# Patient Record
Sex: Male | Born: 1955 | Race: White | Hispanic: No | Marital: Married | Smoking: Never smoker
Health system: Southern US, Community
[De-identification: ages and names within clinical notes are randomized; demographics above are authoritative.]

## PROBLEM LIST (undated history)

## (undated) DIAGNOSIS — I10 Essential (primary) hypertension: Secondary | ICD-10-CM

## (undated) DIAGNOSIS — E785 Hyperlipidemia, unspecified: Secondary | ICD-10-CM

---

## 2002-07-20 ENCOUNTER — Ambulatory Visit (HOSPITAL_BASED_OUTPATIENT_CLINIC_OR_DEPARTMENT_OTHER): Admission: RE | Admit: 2002-07-20 | Discharge: 2002-07-21 | Payer: Self-pay | Admitting: Orthopedic Surgery

## 2009-04-25 ENCOUNTER — Emergency Department (HOSPITAL_BASED_OUTPATIENT_CLINIC_OR_DEPARTMENT_OTHER): Admission: EM | Admit: 2009-04-25 | Discharge: 2009-04-25 | Payer: Self-pay | Admitting: Emergency Medicine

## 2009-06-20 ENCOUNTER — Ambulatory Visit (HOSPITAL_BASED_OUTPATIENT_CLINIC_OR_DEPARTMENT_OTHER): Admission: RE | Admit: 2009-06-20 | Discharge: 2009-06-21 | Payer: Self-pay | Admitting: Orthopedic Surgery

## 2009-07-04 ENCOUNTER — Encounter: Admission: RE | Admit: 2009-07-04 | Discharge: 2009-08-21 | Payer: Self-pay | Admitting: Orthopedic Surgery

## 2010-05-28 LAB — POCT HEMOGLOBIN-HEMACUE: Hemoglobin: 14.9 g/dL (ref 13.0–17.0)

## 2010-07-25 NOTE — Op Note (Signed)
NAME:  Randall Jimenez, Randall Jimenez NO.:  [id number]   MEDICAL RECORD NO.:  [id number]                   PATIENT TYPE:  AMB   LOCATION:  DSC                                  FACILITY:  [facility]   PHYSICIAN:  Loreta Ave, M.D.              DATE OF BIRTH:  05-Nov-1955   DATE OF PROCEDURE:  07/20/2002  DATE OF DISCHARGE:                                 OPERATIVE REPORT   PREOPERATIVE DIAGNOSIS:  Left knee anterior cruciate ligament tear and  lateral meniscus tear.   POSTOPERATIVE DIAGNOSIS:  Left knee anterior cruciate ligament tear and  lateral meniscus tear.  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 No intra-articular loose body.   PROCEDURE:  Left knee examination under anesthesia, arthroscopy, and partial  lateral meniscectomy, arthroscopic endoscopic ACL reconstruction, patella  tendon allograft bone tendon bone with notch plasty, and bioabsorbable screw  fixation.   SURGEON:  Loreta Ave, M.D.   ASSISTANT:  Arlys John D. Petrarca, P.A.-C.   ANESTHESIA:  General.   ESTIMATED BLOOD LOSS:  Minimal.   TOURNIQUET TIME:  55 minutes.   SPECIMENS:  None.  Cultures; none.   COMPLICATIONS:  None.  Dressing; soft compressive with knee immobilizer.   DESCRIPTION OF PROCEDURE:  The patient was brought to the operating room and  after adequate anesthesia had been obtained, the left knee examined.  Positive lateral McMurray's with a displaced fragment lacking full extension  by about 5 degrees.  Positive Lachman, positive drawer, positive pivot-  shift. Collaterals posterior cruciate ligament stable.  Tourniquet applied,  prepped and draped in the usual sterile fashion.  Exsanguinated with  elevation of Esmarch.  Tourniquet inflated to 350 mmHg.  Three portals  created, one superolateral and one each medial and lateral parapatellar.  Inflow catheter introduced.  Knee extended.  Arthroscope introduced and the  knee inspected.  Some focal  chronic grade III changes on the trochlea,  patellofemoral joint very mild.  No chondroplasty necessary.  Good  patellofemoral tracking.  Medial meniscus, medial compartment intact.  Despite the findings on MRI with a questionable loose body, no loose body  was seen in the medial or posterior aspect of the knee or any other  recesses.  It was felt that the ACL tear up near the femoral attachment  irreparable.  ACL debrided.  Notch opened up with a notch plasty.  Lateral  meniscus bucket handle tear irreparable. The torn fragments were removed and  tapered down smoothly leaving a rim of meniscus all the way around.  The  remaining articular cartilage in the medial and lateral compartments looked  good.  All recesses were then thoroughly examined and no loose bodies were  seen.  Graft repair of a 10 mm tunnel with bone tendon bone.  Small incision  made medial to the tibial tubercle.  K-wire driven exiting at the footprint  of the ACL on the tibia.  Overdrilled with a 10 mm reamer.  Femoral  guide  inserted across the tibial tunnel notch and on the back cortex of the femur.  K-wire driven and overdrilled with a reamer for placement of the graft.  Both tunnels were assessed and found to be in good position. Debris cleared  from all recesses.  Two pin passers inserted across both tunnels and out  through a stab wound in the inferolateral thigh.  Nitinol wire brought in  the medial portal and out through the femoral tunnel.  The grafts were  attached to the two pin passers and pulled __________ seating the pegs well  in the tibia and in the femur.  Fixed in the femoral tunnel over the Nitinol  wire with a 9 x 25 bioabsorbable screw.  Excellent capture and fixation.  Nitinol wire and sutures removed.  Knee placed at 90 degrees of flexion,  posterior drawer applied, the graft pulled tightly and fixed in the tibial  tunnel over a Nitinol wire with a 9 x 25 bioabsorbable screw.  Excellent  capture  and fixation of the graft above and below.  Full motion, excellent  stability, and good clearance of the graft when viewed arthroscopically at  completion. All wounds were irrigated and closed with subcutaneous and  subcuticular Vicryl.  Portals closed with nylon. Margins of the wounds and  knee injected with Marcaine. Sterile compressive dressing applied.  Tourniquet deflated and removed.  Knee immobilizer applied.  Anesthesia  reversed.  Brought to the recovery room.  Tolerated the surgery well with no  complications.                                               Loreta Ave, M.D.    DFM/MEDQ  D:  07/20/2002  T:  07/21/2002  Job:  [id number]

## 2016-11-18 ENCOUNTER — Other Ambulatory Visit: Payer: Self-pay | Admitting: Family Medicine

## 2016-11-18 DIAGNOSIS — N289 Disorder of kidney and ureter, unspecified: Secondary | ICD-10-CM

## 2016-11-27 ENCOUNTER — Other Ambulatory Visit: Payer: Self-pay

## 2017-12-13 ENCOUNTER — Ambulatory Visit
Admission: RE | Admit: 2017-12-13 | Discharge: 2017-12-13 | Disposition: A | Discharge status: Home / Self Care | Payer: Self-pay | Source: Ambulatory Visit | Attending: Family Medicine | Admitting: Family Medicine

## 2017-12-13 DIAGNOSIS — N289 Disorder of kidney and ureter, unspecified: Secondary | ICD-10-CM

## 2020-03-23 IMAGING — US US RENAL
1 series · 14 of 25 positions shown · non-contrast
Comparison: None.

CLINICAL DATA: Abnormal kidney function.

EXAM:
RENAL / URINARY TRACT ULTRASOUND COMPLETE

[Series 1: us renal · 0.23mm/px · 14 of 38 slices shown]
[im 1/38]
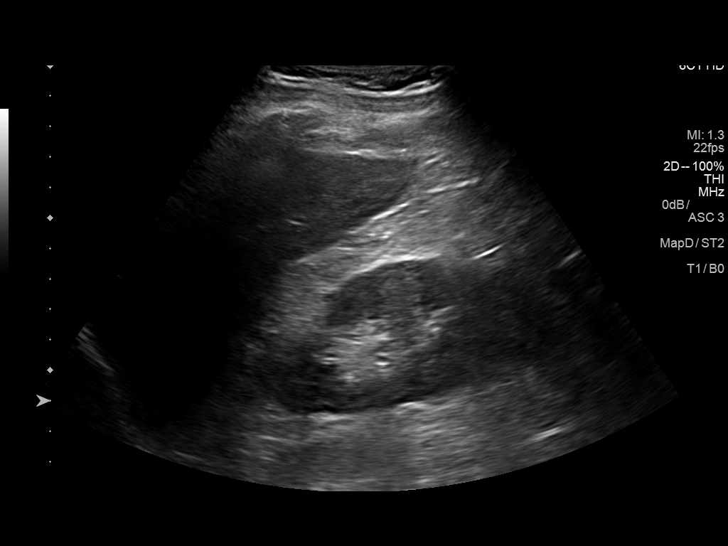
[im 4/38]
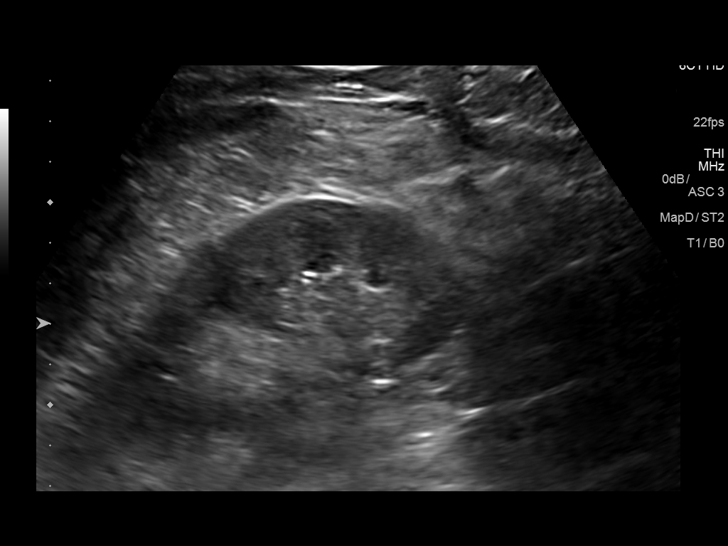
[im 7/38]
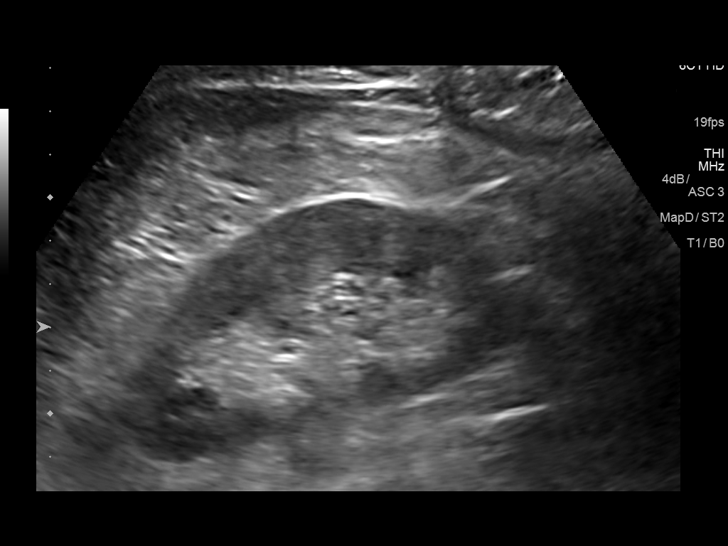
[im 10/38]
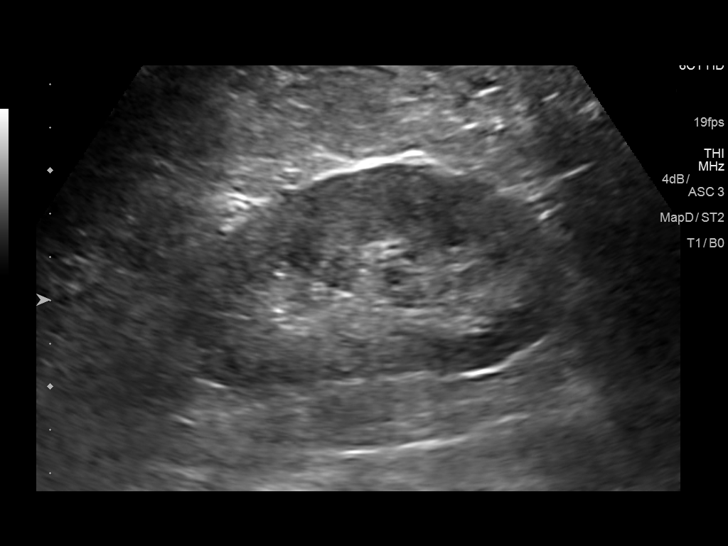
[im 13/38]
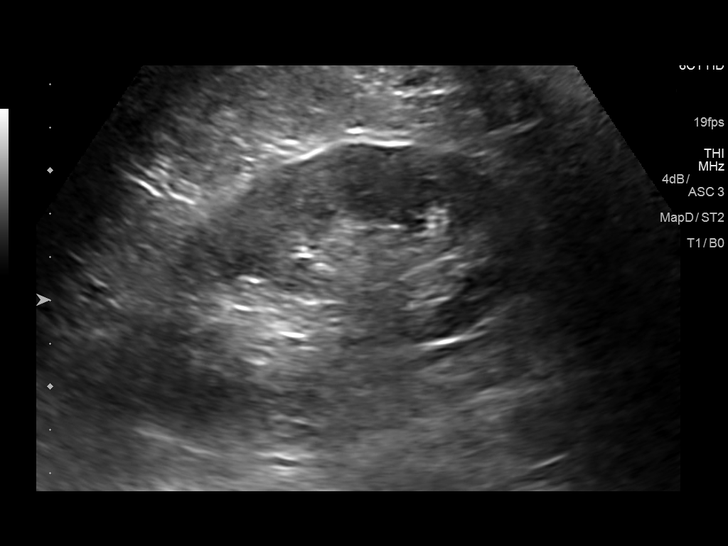
[im 14/38]
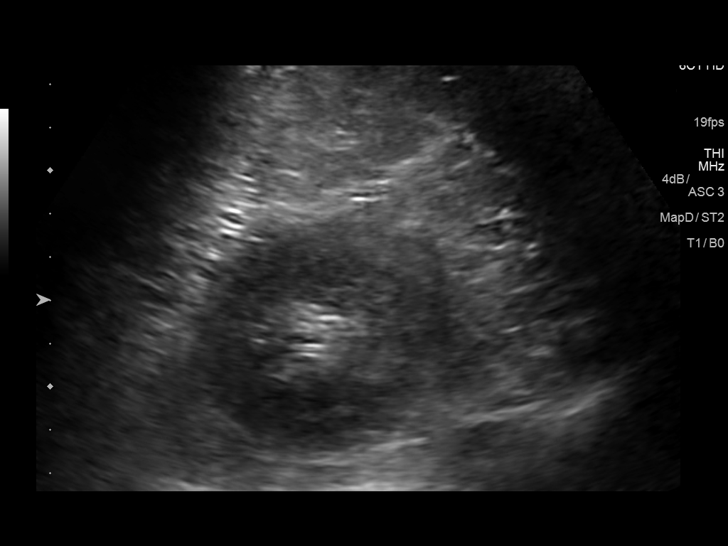
[im 17/38]
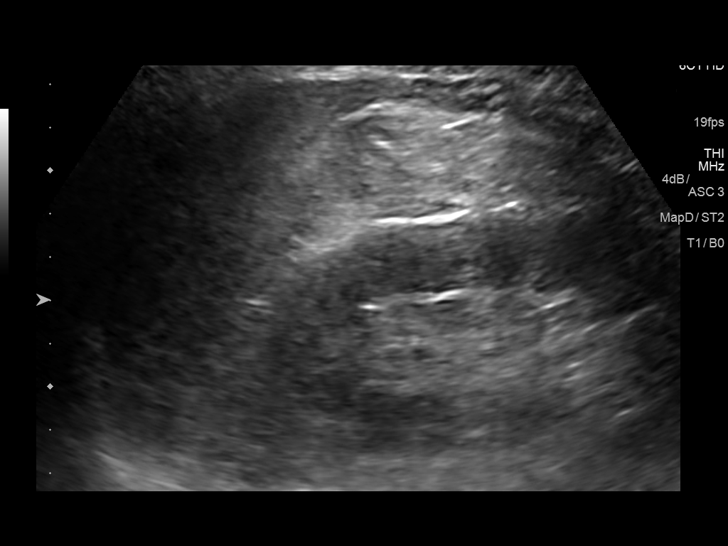
[im 21/38]
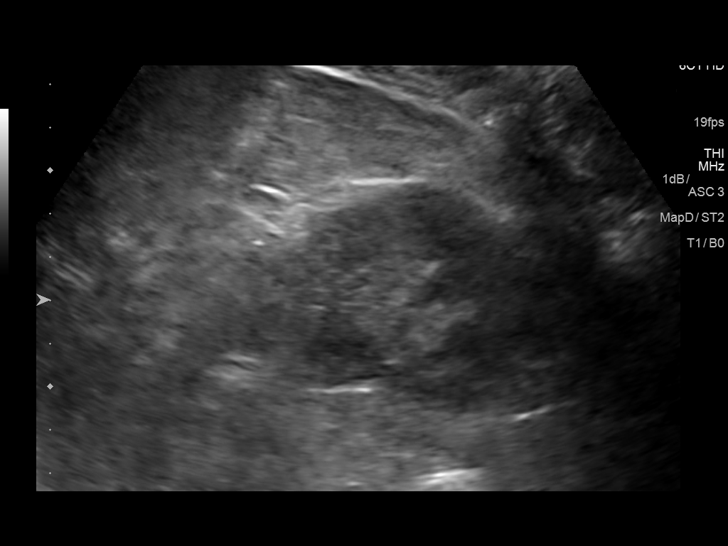
[im 24/38]
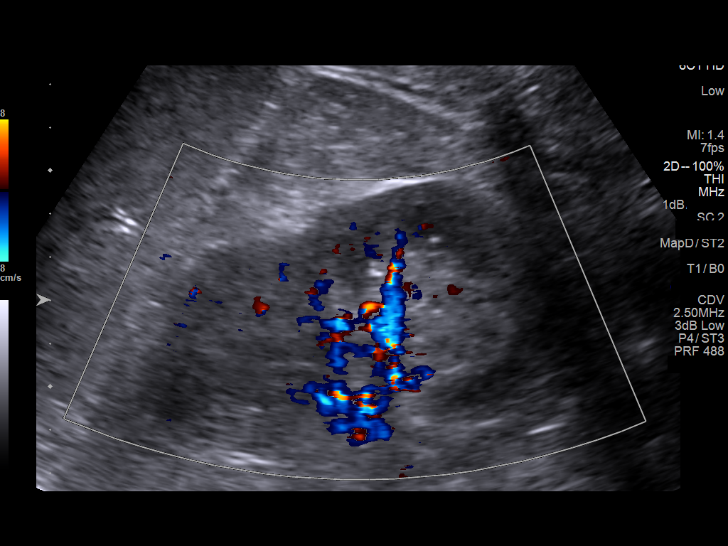
[im 25/38]
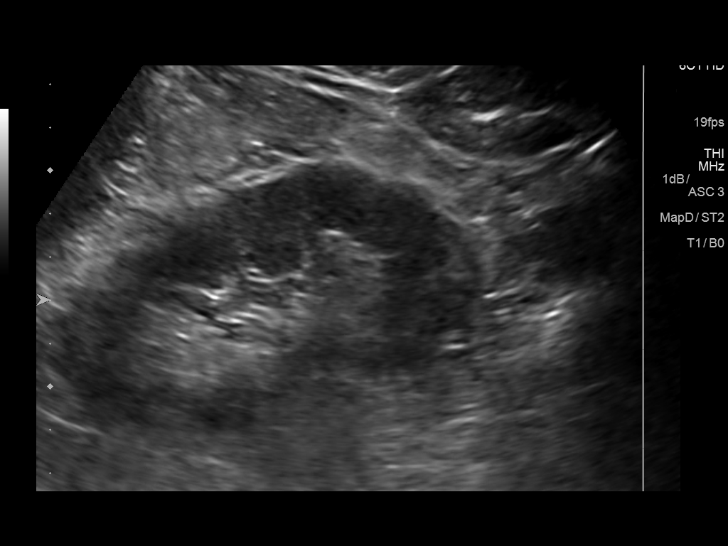
[im 28/38]
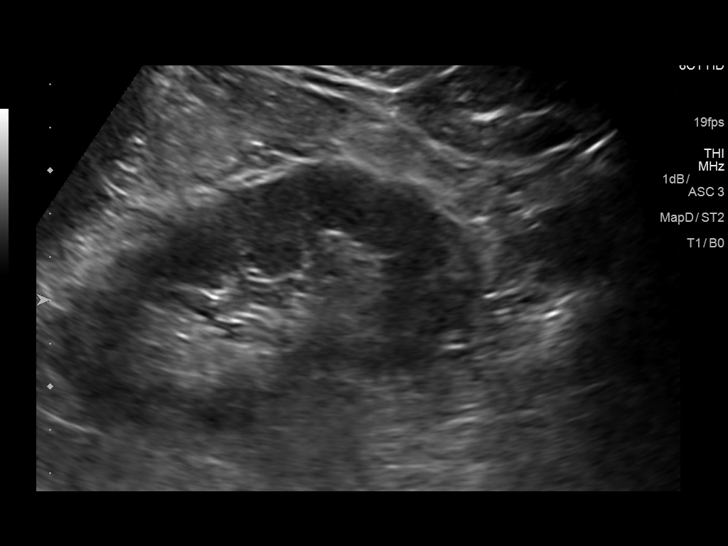
[im 31/38]
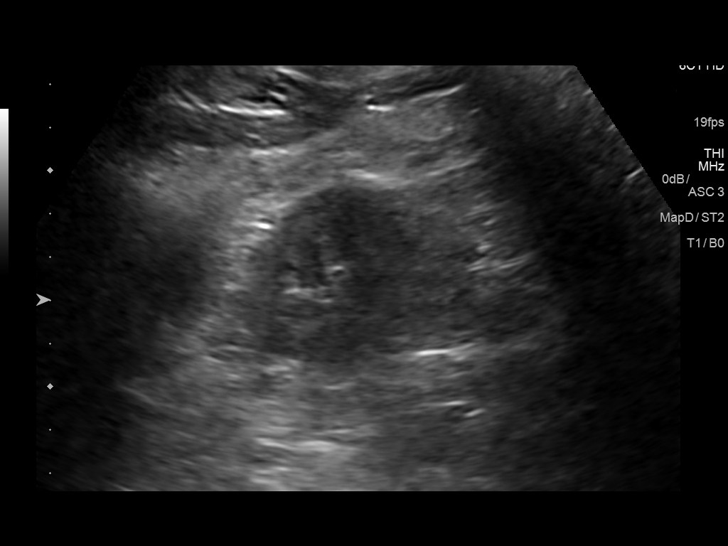
[im 34/38]
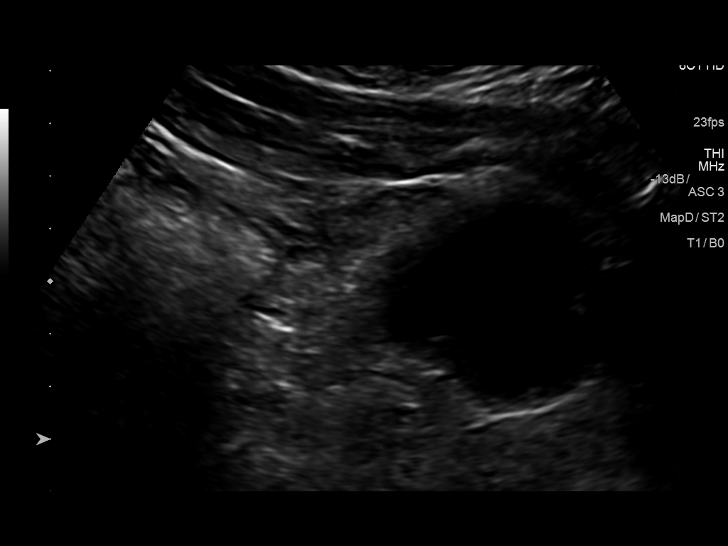
[im 38/38]
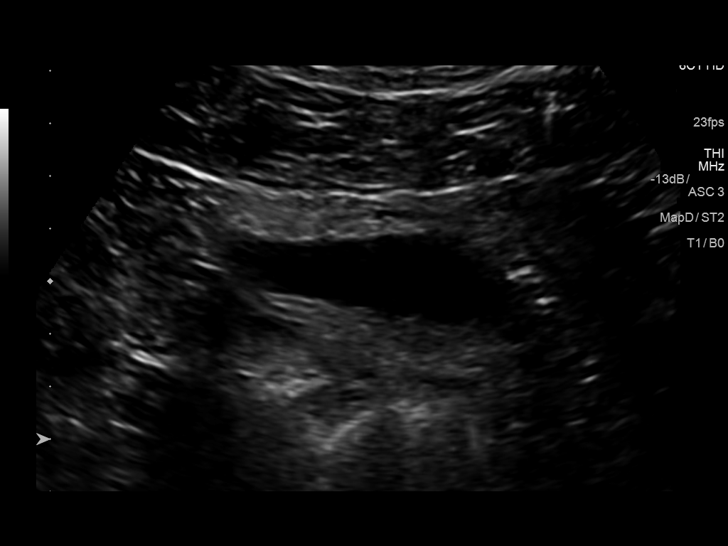

[14 of 25 positions shown; findings below may reference images not displayed]

FINDINGS: Right Kidney:

Length: 10.8 cm. Echogenicity within normal limits. No mass or
hydronephrosis visualized.

Left Kidney:

Length: 10.8 cm. Echogenicity within normal limits. No mass or
hydronephrosis visualized.

Bladder:

Appears normal for degree of bladder distention.
IMPRESSION: Normal renal ultrasound.

## 2020-07-13 ENCOUNTER — Inpatient Hospital Stay (HOSPITAL_COMMUNITY)
Admission: EM | Disposition: A | Discharge status: Home / Self Care | Payer: Self-pay | Source: Home / Self Care | Attending: Cardiovascular Disease

## 2020-07-13 ENCOUNTER — Emergency Department (HOSPITAL_COMMUNITY): Payer: BC Managed Care – PPO

## 2020-07-13 ENCOUNTER — Inpatient Hospital Stay (HOSPITAL_COMMUNITY)
Admission: EM | Admit: 2020-07-13 | Discharge: 2020-07-15 | DRG: 246 | Disposition: A | Discharge status: Home / Self Care | Payer: BC Managed Care – PPO | Attending: Cardiovascular Disease | Admitting: Cardiovascular Disease

## 2020-07-13 ENCOUNTER — Encounter (HOSPITAL_COMMUNITY): Payer: Self-pay | Admitting: Cardiovascular Disease

## 2020-07-13 DIAGNOSIS — Z955 Presence of coronary angioplasty implant and graft: Secondary | ICD-10-CM

## 2020-07-13 DIAGNOSIS — I2510 Atherosclerotic heart disease of native coronary artery without angina pectoris: Secondary | ICD-10-CM | POA: Diagnosis not present

## 2020-07-13 DIAGNOSIS — I10 Essential (primary) hypertension: Secondary | ICD-10-CM | POA: Diagnosis present

## 2020-07-13 DIAGNOSIS — I462 Cardiac arrest due to underlying cardiac condition: Secondary | ICD-10-CM | POA: Diagnosis not present

## 2020-07-13 DIAGNOSIS — I213 ST elevation (STEMI) myocardial infarction of unspecified site: Secondary | ICD-10-CM | POA: Diagnosis not present

## 2020-07-13 DIAGNOSIS — I4901 Ventricular fibrillation: Secondary | ICD-10-CM | POA: Diagnosis present

## 2020-07-13 DIAGNOSIS — I472 Ventricular tachycardia, unspecified: Secondary | ICD-10-CM

## 2020-07-13 DIAGNOSIS — Z20822 Contact with and (suspected) exposure to covid-19: Secondary | ICD-10-CM | POA: Diagnosis present

## 2020-07-13 DIAGNOSIS — I2102 ST elevation (STEMI) myocardial infarction involving left anterior descending coronary artery: Principal | ICD-10-CM | POA: Diagnosis present

## 2020-07-13 DIAGNOSIS — I252 Old myocardial infarction: Secondary | ICD-10-CM | POA: Diagnosis present

## 2020-07-13 DIAGNOSIS — E785 Hyperlipidemia, unspecified: Secondary | ICD-10-CM

## 2020-07-13 DIAGNOSIS — Z8679 Personal history of other diseases of the circulatory system: Secondary | ICD-10-CM | POA: Diagnosis present

## 2020-07-13 DIAGNOSIS — I469 Cardiac arrest, cause unspecified: Secondary | ICD-10-CM

## 2020-07-13 DIAGNOSIS — E782 Mixed hyperlipidemia: Secondary | ICD-10-CM | POA: Diagnosis present

## 2020-07-13 DIAGNOSIS — E7800 Pure hypercholesterolemia, unspecified: Secondary | ICD-10-CM | POA: Diagnosis not present

## 2020-07-13 HISTORY — DX: Hyperlipidemia, unspecified: E78.5

## 2020-07-13 HISTORY — PX: CORONARY/GRAFT ACUTE MI REVASCULARIZATION: CATH118305

## 2020-07-13 HISTORY — DX: Essential (primary) hypertension: I10

## 2020-07-13 HISTORY — DX: Atherosclerotic heart disease of native coronary artery without angina pectoris: I25.10

## 2020-07-13 HISTORY — PX: LEFT HEART CATH AND CORONARY ANGIOGRAPHY: CATH118249

## 2020-07-13 LAB — CBC WITH DIFFERENTIAL/PLATELET
Abs Immature Granulocytes: 0.02 10*3/uL (ref 0.00–0.07)
Basophils Absolute: 0.1 10*3/uL (ref 0.0–0.1)
Basophils Relative: 1 %
Eosinophils Absolute: 0.3 10*3/uL (ref 0.0–0.5)
Eosinophils Relative: 4 %
HCT: 45.9 % (ref 39.0–52.0)
Hemoglobin: 15.1 g/dL (ref 13.0–17.0)
Immature Granulocytes: 0 %
Lymphocytes Relative: 33 %
Lymphs Abs: 2.9 10*3/uL (ref 0.7–4.0)
MCH: 31.2 pg (ref 26.0–34.0)
MCHC: 32.9 g/dL (ref 30.0–36.0)
MCV: 94.8 fL (ref 80.0–100.0)
Monocytes Absolute: 0.7 10*3/uL (ref 0.1–1.0)
Monocytes Relative: 9 %
Neutro Abs: 4.7 10*3/uL (ref 1.7–7.7)
Neutrophils Relative %: 53 %
Platelets: 195 10*3/uL (ref 150–400)
RBC: 4.84 MIL/uL (ref 4.22–5.81)
RDW: 12.6 % (ref 11.5–15.5)
WBC: 8.7 10*3/uL (ref 4.0–10.5)
nRBC: 0 % (ref 0.0–0.2)

## 2020-07-13 LAB — LIPID PANEL
Cholesterol: 212 mg/dL — ABNORMAL HIGH (ref 0–200)
HDL: 75 mg/dL (ref >40)
LDL Cholesterol: 119 mg/dL — ABNORMAL HIGH (ref 0–99)
Total CHOL/HDL Ratio: 2.8 RATIO
Triglycerides: 92 mg/dL (ref <150)
VLDL: 18 mg/dL (ref 0–40)

## 2020-07-13 LAB — HIV ANTIBODY (ROUTINE TESTING W REFLEX): HIV Screen 4th Generation wRfx: NONREACTIVE

## 2020-07-13 LAB — COMPREHENSIVE METABOLIC PANEL
ALT: 31 U/L (ref 0–44)
AST: 32 U/L (ref 15–41)
Albumin: 4.4 g/dL (ref 3.5–5.0)
Alkaline Phosphatase: 55 U/L (ref 38–126)
Anion gap: 13 (ref 5–15)
BUN: 22 mg/dL (ref 8–23)
CO2: 21 mmol/L — ABNORMAL LOW (ref 22–32)
Calcium: 9.3 mg/dL (ref 8.9–10.3)
Chloride: 103 mmol/L (ref 98–111)
Creatinine, Ser: 1.8 mg/dL — ABNORMAL HIGH (ref 0.61–1.24)
GFR, Estimated: 41 mL/min — ABNORMAL LOW (ref >60)
Glucose, Bld: 110 mg/dL — ABNORMAL HIGH (ref 70–99)
Potassium: 3.8 mmol/L (ref 3.5–5.1)
Sodium: 137 mmol/L (ref 135–145)
Total Bilirubin: 1.5 mg/dL — ABNORMAL HIGH (ref 0.3–1.2)
Total Protein: 7.2 g/dL (ref 6.5–8.1)

## 2020-07-13 LAB — TROPONIN I (HIGH SENSITIVITY)
Troponin I (High Sensitivity): 41 ng/L — ABNORMAL HIGH (ref <18)
Troponin I (High Sensitivity): 598 ng/L (ref <18)

## 2020-07-13 LAB — APTT: aPTT: 23 seconds — ABNORMAL LOW (ref 24–36)

## 2020-07-13 LAB — RESP PANEL BY RT-PCR (FLU A&B, COVID) ARPGX2
Influenza A by PCR: NEGATIVE
Influenza B by PCR: NEGATIVE
SARS Coronavirus 2 by RT PCR: NEGATIVE

## 2020-07-13 LAB — MRSA PCR SCREENING: MRSA by PCR: NEGATIVE

## 2020-07-13 LAB — CREATININE, SERUM
Creatinine, Ser: 1.56 mg/dL — ABNORMAL HIGH (ref 0.61–1.24)
GFR, Estimated: 49 mL/min — ABNORMAL LOW (ref >60)

## 2020-07-13 LAB — HEMOGLOBIN A1C
Hgb A1c MFr Bld: 5.7 % — ABNORMAL HIGH (ref 4.8–5.6)
Mean Plasma Glucose: 116.89 mg/dL

## 2020-07-13 LAB — PLATELET COUNT: Platelets: 189 10*3/uL (ref 150–400)

## 2020-07-13 LAB — PROTIME-INR
INR: 1 (ref 0.8–1.2)
Prothrombin Time: 13.4 seconds (ref 11.4–15.2)

## 2020-07-13 LAB — POCT ACTIVATED CLOTTING TIME: Activated Clotting Time: 273 seconds

## 2020-07-13 SURGERY — CORONARY/GRAFT ACUTE MI REVASCULARIZATION
Anesthesia: LOCAL

## 2020-07-13 MED ORDER — AMIODARONE HCL IN DEXTROSE 360-4.14 MG/200ML-% IV SOLN
INTRAVENOUS | Status: AC | PRN
  Administered 2020-07-13: 60 mg/h via INTRAVENOUS

## 2020-07-13 MED ORDER — TICAGRELOR 90 MG PO TABS
ORAL_TABLET | ORAL | Status: DC | PRN
Start: 2020-07-13 — End: 2020-07-13
  Administered 2020-07-13: 180 mg via ORAL

## 2020-07-13 MED ORDER — AMIODARONE HCL IN DEXTROSE 360-4.14 MG/200ML-% IV SOLN
30.0000 mg/h | INTRAVENOUS | Status: DC
  Administered 2020-07-14: 30 mg/h via INTRAVENOUS
  Filled 2020-07-13: qty 200

## 2020-07-13 MED ORDER — NITROGLYCERIN 0.4 MG SL SUBL: 0.4000 mg | SUBLINGUAL_TABLET | SUBLINGUAL | Status: DC | PRN

## 2020-07-13 MED ORDER — SODIUM CHLORIDE 0.9 % IV BOLUS: 500.0000 mL | Freq: Once | INTRAVENOUS | Status: DC

## 2020-07-13 MED ORDER — FENTANYL CITRATE (PF) 100 MCG/2ML IJ SOLN
INTRAMUSCULAR | Status: AC
  Filled 2020-07-13: qty 2

## 2020-07-13 MED ORDER — HYDRALAZINE HCL 20 MG/ML IJ SOLN: 10.0000 mg | INTRAMUSCULAR | Status: AC | PRN

## 2020-07-13 MED ORDER — TENECTEPLASE 50 MG IV KIT: 45.0000 mg | PACK | Freq: Once | INTRAVENOUS | Status: DC

## 2020-07-13 MED ORDER — NOREPINEPHRINE BITARTRATE 1 MG/ML IV SOLN
INTRAVENOUS | Status: DC | PRN
  Administered 2020-07-13: 2 ug/min via INTRAVENOUS

## 2020-07-13 MED ORDER — AMIODARONE HCL IN DEXTROSE 360-4.14 MG/200ML-% IV SOLN
60.0000 mg/h | INTRAVENOUS | Status: AC
  Filled 2020-07-13: qty 200

## 2020-07-13 MED ORDER — TICAGRELOR 90 MG PO TABS
ORAL_TABLET | ORAL | Status: AC
  Filled 2020-07-13: qty 2

## 2020-07-13 MED ORDER — SODIUM CHLORIDE 0.9% FLUSH: 3.0000 mL | INTRAVENOUS | Status: DC | PRN

## 2020-07-13 MED ORDER — ONDANSETRON HCL 4 MG/2ML IJ SOLN: 4.0000 mg | Freq: Four times a day (QID) | INTRAMUSCULAR | Status: DC | PRN

## 2020-07-13 MED ORDER — TICAGRELOR 90 MG PO TABS
90.0000 mg | ORAL_TABLET | Freq: Two times a day (BID) | ORAL | Status: DC
  Administered 2020-07-13 – 2020-07-15 (×4): 90 mg via ORAL
  Filled 2020-07-13 (×4): qty 1

## 2020-07-13 MED ORDER — ASPIRIN EC 81 MG PO TBEC: 81.0000 mg | DELAYED_RELEASE_TABLET | Freq: Every day | ORAL | Status: DC

## 2020-07-13 MED ORDER — AMIODARONE HCL IN DEXTROSE 360-4.14 MG/200ML-% IV SOLN
INTRAVENOUS | Status: AC
  Filled 2020-07-13: qty 200

## 2020-07-13 MED ORDER — OXYCODONE HCL 5 MG PO TABS: 5.0000 mg | ORAL_TABLET | ORAL | Status: DC | PRN

## 2020-07-13 MED ORDER — IOHEXOL 350 MG/ML SOLN
INTRAVENOUS | Status: DC | PRN
Start: 2020-07-13 — End: 2020-07-13
  Administered 2020-07-13: 100 mL

## 2020-07-13 MED ORDER — SODIUM CHLORIDE 0.9 % IV SOLN: INTRAVENOUS | Status: DC

## 2020-07-13 MED ORDER — CLOPIDOGREL BISULFATE 75 MG PO TABS: 75.0000 mg | ORAL_TABLET | Freq: Every day | ORAL | Status: DC

## 2020-07-13 MED ORDER — TIROFIBAN HCL IV 12.5 MG/250 ML
0.0750 ug/kg/min | INTRAVENOUS | Status: AC
  Administered 2020-07-13: 0.075 ug/kg/min via INTRAVENOUS
  Filled 2020-07-13: qty 250

## 2020-07-13 MED ORDER — ASPIRIN 81 MG PO CHEW: 324.0000 mg | CHEWABLE_TABLET | ORAL | Status: DC

## 2020-07-13 MED ORDER — SODIUM CHLORIDE 0.9% FLUSH: 3.0000 mL | Freq: Two times a day (BID) | INTRAVENOUS | Status: DC

## 2020-07-13 MED ORDER — LIDOCAINE HCL (CARDIAC) PF 100 MG/5ML IV SOSY
PREFILLED_SYRINGE | INTRAVENOUS | Status: DC | PRN
  Administered 2020-07-13: 100 mg via INTRAVENOUS

## 2020-07-13 MED ORDER — ASPIRIN 300 MG RE SUPP: 300.0000 mg | RECTAL | Status: DC

## 2020-07-13 MED ORDER — NITROGLYCERIN 0.4 MG SL SUBL
SUBLINGUAL_TABLET | SUBLINGUAL | Status: AC
  Filled 2020-07-13: qty 1

## 2020-07-13 MED ORDER — ASPIRIN EC 81 MG PO TBEC
81.0000 mg | DELAYED_RELEASE_TABLET | Freq: Every day | ORAL | Status: DC
  Administered 2020-07-14 – 2020-07-15 (×2): 81 mg via ORAL
  Filled 2020-07-13 (×2): qty 1

## 2020-07-13 MED ORDER — IOHEXOL 350 MG/ML SOLN
INTRAVENOUS | Status: AC
  Filled 2020-07-13: qty 1

## 2020-07-13 MED ORDER — SODIUM CHLORIDE 0.9 % IV SOLN
INTRAVENOUS | Status: DC
  Administered 2020-07-13: 20 mL via INTRAVENOUS

## 2020-07-13 MED ORDER — CARVEDILOL 3.125 MG PO TABS
3.1250 mg | ORAL_TABLET | Freq: Two times a day (BID) | ORAL | Status: DC
  Administered 2020-07-14 – 2020-07-15 (×3): 3.125 mg via ORAL
  Filled 2020-07-13 (×3): qty 1

## 2020-07-13 MED ORDER — DIAZEPAM 5 MG PO TABS: 5.0000 mg | ORAL_TABLET | ORAL | Status: DC | PRN

## 2020-07-13 MED ORDER — TIROFIBAN (AGGRASTAT) BOLUS VIA INFUSION
INTRAVENOUS | Status: DC | PRN
  Administered 2020-07-13: 2177.5 ug via INTRAVENOUS

## 2020-07-13 MED ORDER — LABETALOL HCL 5 MG/ML IV SOLN: 10.0000 mg | INTRAVENOUS | Status: AC | PRN

## 2020-07-13 MED ORDER — MIDAZOLAM HCL 2 MG/2ML IJ SOLN
INTRAMUSCULAR | Status: AC
  Filled 2020-07-13: qty 2

## 2020-07-13 MED ORDER — CLOPIDOGREL BISULFATE 300 MG PO TABS: 300.0000 mg | ORAL_TABLET | Freq: Once | ORAL | Status: DC

## 2020-07-13 MED ORDER — VERAPAMIL HCL 2.5 MG/ML IV SOLN
INTRAVENOUS | Status: DC | PRN
  Administered 2020-07-13: 10 mL via INTRA_ARTERIAL

## 2020-07-13 MED ORDER — MIDAZOLAM HCL 2 MG/2ML IJ SOLN
INTRAMUSCULAR | Status: DC | PRN
  Administered 2020-07-13: 1 mg via INTRAVENOUS

## 2020-07-13 MED ORDER — HEPARIN (PORCINE) IN NACL 1000-0.9 UT/500ML-% IV SOLN
INTRAVENOUS | Status: DC | PRN
  Administered 2020-07-13 (×3): 500 mL

## 2020-07-13 MED ORDER — ENOXAPARIN SODIUM 40 MG/0.4ML IJ SOSY
40.0000 mg | PREFILLED_SYRINGE | INTRAMUSCULAR | Status: DC
  Administered 2020-07-14: 40 mg via SUBCUTANEOUS
  Filled 2020-07-13 (×2): qty 0.4

## 2020-07-13 MED ORDER — TIROFIBAN HCL IN NACL 5-0.9 MG/100ML-% IV SOLN
INTRAVENOUS | Status: AC | PRN
  Administered 2020-07-13: 0.15 ug/kg/min via INTRAVENOUS

## 2020-07-13 MED ORDER — ATORVASTATIN CALCIUM 80 MG PO TABS
80.0000 mg | ORAL_TABLET | Freq: Every day | ORAL | Status: DC
  Administered 2020-07-13 – 2020-07-14 (×2): 80 mg via ORAL
  Filled 2020-07-13 (×2): qty 1

## 2020-07-13 MED ORDER — LIDOCAINE HCL (PF) 1 % IJ SOLN
INTRAMUSCULAR | Status: DC | PRN
Start: 2020-07-13 — End: 2020-07-13
  Administered 2020-07-13: 2 mL

## 2020-07-13 MED ORDER — SODIUM CHLORIDE 0.9 % IV SOLN: Freq: Once | INTRAVENOUS | Status: DC

## 2020-07-13 MED ORDER — HEPARIN SODIUM (PORCINE) 5000 UNIT/ML IJ SOLN: 4000.0000 [IU] | Freq: Once | INTRAMUSCULAR | Status: AC

## 2020-07-13 MED ORDER — LIDOCAINE HCL (PF) 1 % IJ SOLN
INTRAMUSCULAR | Status: AC
  Filled 2020-07-13: qty 30

## 2020-07-13 MED ORDER — TIROFIBAN HCL IN NACL 5-0.9 MG/100ML-% IV SOLN
INTRAVENOUS | Status: AC
  Filled 2020-07-13: qty 100

## 2020-07-13 MED ORDER — SODIUM CHLORIDE 0.9 % IV SOLN: 250.0000 mL | INTRAVENOUS | Status: DC | PRN

## 2020-07-13 MED ORDER — HEPARIN (PORCINE) IN NACL 1000-0.9 UT/500ML-% IV SOLN
INTRAVENOUS | Status: AC
  Filled 2020-07-13: qty 1000

## 2020-07-13 MED ORDER — SODIUM CHLORIDE 0.9 % WEIGHT BASED INFUSION
1.0000 mL/kg/h | INTRAVENOUS | Status: AC
  Administered 2020-07-13: 1 mL/kg/h via INTRAVENOUS

## 2020-07-13 MED ORDER — MORPHINE SULFATE (PF) 4 MG/ML IV SOLN
4.0000 mg | Freq: Once | INTRAVENOUS | Status: DC
  Filled 2020-07-13: qty 1

## 2020-07-13 MED ORDER — HEPARIN SODIUM (PORCINE) 1000 UNIT/ML IJ SOLN
INTRAMUSCULAR | Status: AC
  Filled 2020-07-13: qty 1

## 2020-07-13 MED ORDER — LIDOCAINE HCL (CARDIAC) PF 100 MG/5ML IV SOSY
PREFILLED_SYRINGE | INTRAVENOUS | Status: AC
  Filled 2020-07-13: qty 5

## 2020-07-13 MED ORDER — FENTANYL CITRATE (PF) 100 MCG/2ML IJ SOLN
INTRAMUSCULAR | Status: DC | PRN
  Administered 2020-07-13: 25 ug via INTRAVENOUS

## 2020-07-13 MED ORDER — VERAPAMIL HCL 2.5 MG/ML IV SOLN
INTRAVENOUS | Status: AC
  Filled 2020-07-13: qty 2

## 2020-07-13 MED ORDER — SODIUM CHLORIDE 0.9% FLUSH
3.0000 mL | Freq: Two times a day (BID) | INTRAVENOUS | Status: DC
  Administered 2020-07-13 – 2020-07-15 (×3): 3 mL via INTRAVENOUS

## 2020-07-13 MED ORDER — HEPARIN SODIUM (PORCINE) 5000 UNIT/ML IJ SOLN
INTRAMUSCULAR | Status: AC
  Administered 2020-07-13: 4000 [IU] via INTRAVENOUS
  Filled 2020-07-13: qty 1

## 2020-07-13 MED ORDER — ACETAMINOPHEN 325 MG PO TABS: 650.0000 mg | ORAL_TABLET | ORAL | Status: DC | PRN

## 2020-07-13 MED ORDER — HEPARIN SODIUM (PORCINE) 1000 UNIT/ML IJ SOLN
INTRAMUSCULAR | Status: DC | PRN
  Administered 2020-07-13: 4000 [IU] via INTRAVENOUS

## 2020-07-13 MED ORDER — AMIODARONE HCL 150 MG/3ML IV SOLN
INTRAVENOUS | Status: AC
  Filled 2020-07-13: qty 6

## 2020-07-13 SURGICAL SUPPLY — 19 items
BALLN SAPPHIRE 2.5X15 (BALLOONS) ×2
BALLN SAPPHIRE ~~LOC~~ 3.75X10 (BALLOONS) ×2 IMPLANT
BALLOON SAPPHIRE 2.5X15 (BALLOONS) ×1 IMPLANT
CATH 5FR JL3.5 JR4 ANG PIG MP (CATHETERS) ×2 IMPLANT
CATH LAUNCHER 6FR EBU3.5 (CATHETERS) ×2 IMPLANT
DEVICE RAD COMP TR BAND LRG (VASCULAR PRODUCTS) ×2 IMPLANT
GLIDESHEATH SLEND SS 6F .021 (SHEATH) ×2 IMPLANT
GUIDEWIRE INQWIRE 1.5J.035X260 (WIRE) ×1 IMPLANT
INQWIRE 1.5J .035X260CM (WIRE) ×2
KIT ENCORE 26 ADVANTAGE (KITS) ×2 IMPLANT
KIT ESSENTIALS PG (KITS) ×2 IMPLANT
KIT HEART LEFT (KITS) ×2 IMPLANT
PACK CARDIAC CATHETERIZATION (CUSTOM PROCEDURE TRAY) ×2 IMPLANT
STENT SYNERGY XD 3.50X20 (Permanent Stent) ×1 IMPLANT
SYNERGY XD 3.50X20 (Permanent Stent) ×2 IMPLANT
SYR MEDRAD MARK 7 150ML (SYRINGE) ×2 IMPLANT
TRANSDUCER W/STOPCOCK (MISCELLANEOUS) ×2 IMPLANT
TUBING CIL FLEX 10 FLL-RA (TUBING) ×2 IMPLANT
WIRE COUGAR XT STRL 190CM (WIRE) ×4 IMPLANT

## 2020-07-13 NOTE — H&P (Signed)
Cardiology Admission History and Physical:   Patient ID: Randall Jimenez MRN: [id number]; DOB: November 12, 1955   Admission date: 07/13/2020  PCP:  Catha Gosselin, MD   [facility] Providers Cardiologist:  None        Chief Complaint:  Chest pain  Patient Profile:   Randall Jimenez is a 65 y.o. male with a history of hypertension who is being seen 07/13/2020 for the evaluation of chest pain/STEMI.  History of Present Illness:   Mr. Mallek is a healthy 65 year old male with no past cardiac history.  The patient is very active.  He walked 7 miles yesterday and he was out doing yard work this morning with no problems.  States that he worked outside for a few hours.  After coming inside and drinking some coffee, he developed substernal chest discomfort.  His pain persisted and was associated with diaphoresis.  EMS was called and a code STEMI was diagnosed in the field.  The patient had an anterior ST elevation pattern.  On arrival to the emergency room, while waiting for the Cath Lab team arrival, the patient had a ventricular fibrillation arrest.  He required about 1 minute of CPR and 1 shock.  He was treated with 300 mg of amiodarone.  On my evaluation in the emergency department, the patient was awake and alert.  His chest pain had resolved.  He had no other complaints.  He specifically denied any associated nausea, vomiting, or diaphoresis.  He has had no recent exertional symptoms.  The patient only takes 1 medication for high blood pressure.  He has not on any medicines.  He does not smoke.  He denies any family history of premature CAD.   Past Medical History:  Diagnosis Date  . Hyperlipidemia   . Hypertension     History reviewed. No pertinent surgical history.   Medications Prior to Admission: Prior to Admission medications   Not on File     Allergies:   Not on File  Social History:   Social History   Socioeconomic History  . Marital status: Married     Spouse name: Not on file  . Number of children: Not on file  . Years of education: Not on file  . Highest education level: Not on file  Occupational History  . Not on file  Tobacco Use  . Smoking status: Never Smoker  . Smokeless tobacco: Never Used  Substance and Sexual Activity  . Alcohol use: Not on file  . Drug use: Never  . Sexual activity: Not on file  Other Topics Concern  . Not on file  Social History Narrative  . Not on file   Social Determinants of Health   Financial Resource Strain: Not on file  Food Insecurity: Not on file  Transportation Needs: Not on file  Physical Activity: Not on file  Stress: Not on file  Social Connections: Not on file  Intimate Partner Violence: Not on file    Family History:   The patient's family history is negative for CAD.    ROS:  Please see the history of present illness.  All other ROS reviewed and negative.     Physical Exam/Data:   Vitals:   07/13/20 1254 07/13/20 1259 07/13/20 1304 07/13/20 1309  BP: 97/60 97/61 98/64  103/60  Pulse: 63 (!) 58 (!) 59 61  Resp: 11 13 12 11   SpO2: 99% 99% 99% 100%  Weight:      Height:       No intake or output  data in the 24 hours ending 07/13/20 1330 Last 3 Weights 07/13/2020  Weight (lbs) 192 lb  Weight (kg) 87.091 kg     Body mass index is 30.07 kg/m.  General:  Well nourished, well developed, in no acute distress HEENT: normal Lymph: no adenopathy Neck: no JVD Endocrine:  No thryomegaly Vascular: No carotid bruits; FA pulses 2+ bilaterally  Cardiac:  normal S1, S2; RRR; no murmur  Lungs:  clear to auscultation bilaterally, no wheezing, rhonchi or rales  Abd: soft, nontender, no hepatomegaly  Ext: no edema Musculoskeletal:  No deformities, BUE and BLE strength normal and equal Skin: warm and dry  Neuro:  CNs 2-12 intact, no focal abnormalities noted Psych:  Normal affect    EKG:  The ECG that was done by EMS was personally reviewed and demonstrates normal sinus rhythm  with acute anterior injury current consistent with anterior STEMI  Relevant CV Studies: Pending  Laboratory Data:  High Sensitivity Troponin:   Recent Labs  Lab 07/13/20 1202  TROPONINIHS 41*      Chemistry Recent Labs  Lab 07/13/20 1202  NA 137  K 3.8  CL 103  CO2 21*  GLUCOSE 110*  BUN 22  CREATININE 1.80*  CALCIUM 9.3  GFRNONAA 41*  ANIONGAP 13    Recent Labs  Lab 07/13/20 1202  PROT 7.2  ALBUMIN 4.4  AST 32  ALT 31  ALKPHOS 55  BILITOT 1.5*   Hematology Recent Labs  Lab 07/13/20 1202  WBC 8.7  RBC 4.84  HGB 15.1  HCT 45.9  MCV 94.8  MCH 31.2  MCHC 32.9  RDW 12.6  PLT 195   BNPNo results for input(s): BNP, PROBNP in the last 168 hours.  DDimer No results for input(s): DDIMER in the last 168 hours.   Radiology/Studies:  DG Chest Port 1 View  Result Date: 07/13/2020 CLINICAL DATA:  Myocardial infarction. EXAM: PORTABLE CHEST 1 VIEW COMPARISON:  None. FINDINGS: The heart size and mediastinal contours are within normal limits. Mild left basilar atelectasis/airspace disease is noted. There is mild pulmonary vascular congestion. There is no pleural effusion or pneumothorax. The visualized skeletal structures are unremarkable. IMPRESSION: Mild left basilar atelectasis/airspace disease. Mild pulmonary vascular congestion. Electronically Signed   By: Romona Curls M.D.   On: 07/13/2020 12:28     Assessment and Plan:   1. Acute anterior STEMI 2. Hypertension 3. Mixed hyperlipidemia, LDL cholesterol 119 mg/dL  Patient received aspirin 324 mg, 4000 units of heparin, and amiodarone 300 mg in the emergency department.  He will be taken emergently for cardiac catheterization and PCI in the setting of an anterior STEMI complicated by ventricular fibrillation.  The patient is awake, alert, and hemodynamically stable at the time of my evaluation.  His chest pain is returning on arrival to the Cath Lab.  Further plans/disposition pending his cardiac catheterization  results.  He will be treated with appropriate post MI medical therapy with dual antiplatelet therapy, high intensity statin drug, beta-blocker, and an ACE or ARB pending evaluation of his LV function.   Risk Assessment/Risk Scores:    TIMI Risk Score for ST  Elevation MI:   The patient's TIMI risk score is 3, which indicates a 4.4% risk of all cause mortality at 30 days.        Severity of Illness: The appropriate patient status for this patient is INPATIENT. Inpatient status is judged to be reasonable and necessary in order to provide the required intensity of service to ensure the patient's  safety. The patient's presenting symptoms, physical exam findings, and initial radiographic and laboratory data in the context of their chronic comorbidities is felt to place them at high risk for further clinical deterioration. Furthermore, it is not anticipated that the patient will be medically stable for discharge from the hospital within 2 midnights of admission.   * I certify that at the point of admission it is my clinical judgment that the patient will require inpatient hospital care spanning beyond 2 midnights from the point of admission due to high intensity of service, high risk for further deterioration and high frequency of surveillance required.*    For questions or updates, please contact [facility] Please consult [email or web address] for contact info under     Signed, Tonny Bollman, MD  07/13/2020 1:30 PM

## 2020-07-13 NOTE — Progress Notes (Signed)
   07/13/20 1145  Clinical Encounter Type  Visited With Family  Visit Type Code  Referral From Nurse  Consult/Referral To Chaplain  Spiritual Encounters  Spiritual Needs Emotional   Pt's wife: Melody Haver, [phone]   Chaplain responded to Code STEMI. Informed by security that Pt's wife, Jearld Adjutant, was outside. Chaplain brought [place] to Consult A. Chaplain engaged active listening and provided emotional support. Pt's daughter called her at work to let her know something was happening with the Pt. Jearld Adjutant shared that Pt is usually healthy, so this even is surprising. When Pt was taken to Cath Lab, Chaplain brought Jearld Adjutant to [facility] waiting area. Chaplain let Pt's doctor know where Pt's wife was. Chaplain updated Jearld Adjutant on when she might expect to be updated. Chaplain offered to sit with her, but she declined at present. Chaplain remains available.   This note was prepared by Chaplain Resident, Tacy Learn, MDiv. Chaplain remains available as needed through the on-call pager: [phone].

## 2020-07-13 NOTE — ED Triage Notes (Signed)
Pt BIB [facility] from home. Patient was doing yard work today went in for shower, sudden onset midsternal chest pain, diaphoretic. Pt received 324 aspirin.

## 2020-07-13 NOTE — Discharge Instructions (Signed)

## 2020-07-13 NOTE — ED Provider Notes (Signed)
[facility] EMERGENCY DEPARTMENT Provider Note   CSN: [id number] Arrival date & time: 07/13/20  1155     History Chief Complaint  Patient presents with  . Code STEMI    Randall Jimenez is a 65 y.o. male.  HPI Patient has minimal past medical history.  He reports he takes a low-dose antihypertensive more as a preventative measure than having significant hypertension.  Patient denies any history of known cardiac disease.  He was doing some heavy yard work this afternoon.  He reports that about 1050 he went inside and had a painful pressure in his chest.  He reports he also broke out in a sweat.  He tried cold drink to see if would help.  Symptoms only continued to worsen with some nausea.  It became intense.  EMS was contacted.  EMS identified STEMI and code STEMI paged.  Patient was given aspirin 325 mg.  Patient is a non-smoker.  COVID up-to-date including booster    No past medical history on file.  There are no problems to display for this patient.        No family history on file.     Home Medications Prior to Admission medications   Not on File    Allergies    Patient has no allergy information on record.  Review of Systems   Review of Systems 10 systems reviewed and negative except as per HPI Physical Exam Updated Vital Signs Ht 5\' 7"  (1.702 m)   Wt 87.1 kg   SpO2 98%   BMI 30.07 kg/m   Physical Exam Constitutional:      Comments: Patient appears very uncomfortable and is holding his chest.  His mental status is clear.  No respiratory distress.  Well-nourished well-developed.  HENT:     Head: Normocephalic and atraumatic.     Mouth/Throat:     Mouth: Mucous membranes are moist.     Pharynx: Oropharynx is clear.  Eyes:     Extraocular Movements: Extraocular movements intact.  Cardiovascular:     Rate and Rhythm: Normal rate and regular rhythm.  Pulmonary:     Effort: Pulmonary effort is normal.     Breath sounds: Normal  breath sounds.  Abdominal:     General: There is no distension.     Palpations: Abdomen is soft.     Tenderness: There is no abdominal tenderness. There is no guarding.  Musculoskeletal:        General: No swelling or tenderness. Normal range of motion.     Right lower leg: No edema.     Left lower leg: No edema.  Skin:    General: Skin is warm.     Comments: Skin warm, forehead slightly diaphoretic  Neurological:     General: No focal deficit present.     Mental Status: He is oriented to person, place, and time.     Cranial Nerves: No cranial nerve deficit.     Coordination: Coordination normal.     ED Results / Procedures / Treatments   Labs (all labs ordered are listed, but only abnormal results are displayed) Labs Reviewed  HEMOGLOBIN A1C - Abnormal; Notable for the following components:      Result Value   Hgb A1c MFr Bld 5.7 (*)    All other components within normal limits  RESP PANEL BY RT-PCR (FLU A&B, COVID) ARPGX2  CBC WITH DIFFERENTIAL/PLATELET  PROTIME-INR  APTT  COMPREHENSIVE METABOLIC PANEL  LIPID PANEL  TROPONIN I (HIGH SENSITIVITY)  EKG EKG Interpretation  Date/Time:  Saturday Jul 13 2020 12:02:33 EDT Ventricular Rate:  64 PR Interval:  141 QRS Duration: 98 QT Interval:  438 QTC Calculation: 452 R Axis:   -30 Text Interpretation: Sinus rhythm Left axis deviation Abnormal R-wave progression, early transition anterior STEMI with reciprocalchanges Confirmed by Arby Barrette [phone]) on 07/13/2020 12:36:52 PM   Radiology DG Chest Port 1 View  Result Date: 07/13/2020 CLINICAL DATA:  Myocardial infarction. EXAM: PORTABLE CHEST 1 VIEW COMPARISON:  None. FINDINGS: The heart size and mediastinal contours are within normal limits. Mild left basilar atelectasis/airspace disease is noted. There is mild pulmonary vascular congestion. There is no pleural effusion or pneumothorax. The visualized skeletal structures are unremarkable. IMPRESSION: Mild left  basilar atelectasis/airspace disease. Mild pulmonary vascular congestion. Electronically Signed   By: Romona Curls M.D.   On: 07/13/2020 12:28    Procedures Procedures  CRITICAL CARE Performed by: Arby Barrette   Total critical care time: 45 minutes  Critical care time was exclusive of separately billable procedures and treating other patients.  Critical care was necessary to treat or prevent imminent or life-threatening deterioration.  Critical care was time spent personally by me on the following activities: development of treatment plan with patient and/or surrogate as well as nursing, discussions with consultants, evaluation of patient's response to treatment, examination of patient, obtaining history from patient or surrogate, ordering and performing treatments and interventions, ordering and review of laboratory studies, ordering and review of radiographic studies, pulse oximetry and re-evaluation of patient's condition. Medications Ordered in ED Medications  0.9 %  sodium chloride infusion (20 mLs Intravenous New Bag/Given 07/13/20 1207)  morphine 4 MG/ML injection 4 mg ( Intravenous MAR Hold 07/13/20 1223)  nitroGLYCERIN (NITROSTAT) SL tablet 0.4 mg ( Sublingual MAR Hold 07/13/20 1223)  sodium chloride 0.9 % bolus 500 mL ( Intravenous MAR Hold 07/13/20 1223)  0.9 %  sodium chloride infusion ( Intravenous MAR Hold 07/13/20 1223)  0.9 %  sodium chloride infusion (has no administration in time range)  sodium chloride flush (NS) 0.9 % injection 3 mL ( Intravenous Automatically Held 07/21/20 2200)  sodium chloride flush (NS) 0.9 % injection 3 mL ( Intravenous MAR Hold 07/13/20 1223)  0.9 %  sodium chloride infusion ( Intravenous MAR Hold 07/13/20 1223)  aspirin chewable tablet 324 mg ( Oral MAR Hold 07/13/20 1223)    Or  aspirin suppository 300 mg ( Rectal MAR Hold 07/13/20 1223)  clopidogrel (PLAVIX) tablet 300 mg ( Oral MAR Hold 07/13/20 1223)    Followed by  clopidogrel (PLAVIX) tablet 75 mg (  Oral Automatically Held 07/22/20 0800)  aspirin EC tablet 81 mg ( Oral Automatically Held 07/22/20 1000)  heparin injection 4,000 Units (4,000 Units Intravenous Given 07/13/20 1206)  nitroGLYCERIN (NITROSTAT) 0.4 MG SL tablet (  Given 07/13/20 1206)    ED Course  I have reviewed the triage vital signs and the nursing notes.  Pertinent labs & imaging results that were available during my care of the patient were reviewed by me and considered in my medical decision making (see chart for details).    MDM Rules/Calculators/A&P                         Consult: Dr. Excell Seltzer for STEMI team.  Will take patient to the Cath Lab.  Patient presented to the emergency department with an anterior ST elevation MI.  He was having active chest pain.  Patient had gotten aspirin prior  to arrival.  Heparin bolus administered.  Morphine and sublingual nitroglycerin administered.  At approximately 1207, patient was observed to have 3 and 4 runs of PVCs sequentially.  Then subsequently went to a V. tach arrest with snoring respirations.  I arrived at bedside nursing staff was performing chest compressions.  Monitor showed V. tach with torsades.  Patient was unresponsive.  Bag-valve-mask being applied.  At that time patient was shocked with 120 J unsynchronized.  There was return of a narrow complex rhythm.  Continue to assist ventilate the patient with bag-valve-mask.  He resumed spontaneous respirations.  Postconversion narrow complex rate was tachycardic up to 150s.  Amiodarone 300 mg bolus administered. Magnesium 2 grams ordered. I planned for administration of Tenecteplase if Cath Lab was not immediately available.  Continued respiratory support patient maintained his airway and regained clear level of consciousness, reporting resolution   Heart rate decreased into the 80s with a narrow complex, sinus rhythm.  At that time, Dr. Excell Seltzer arrived for transfer to Cath Lab.  Time of transfer to the Cath Lab, patient had clear mental  status.  He was protecting his airway.  Narrow complex rhythm in the 80s on the monitor. Final Clinical Impression(s) / ED Diagnoses Final diagnoses:  ST elevation myocardial infarction (STEMI), unspecified artery [facility])  Cardiac arrest with successful resuscitation [facility])  Ventricular tachycardia [facility])    Rx / DC Orders ED Discharge Orders    None       Arby Barrette, MD 07/13/20 1255

## 2020-07-13 NOTE — ED Notes (Addendum)
Pt experiencing runs of 3 and 4 PVCs, this RN went to make EDP aware, upon returning pt in vtach, snoring respirations.   1209: pt with no pulse @ this time, CPR initiated, EDP to bedside. 1210: pt shocked @ 120J, pulses regained, sinus rhythm on monitor @ this time. 1212: 300 mg push of amiodarone per EDP 1213: pt A&Ox4, NRB 1217: 2 g Magnesium given per EDP 1219: Dr Excell Seltzer @ bedside, patient transported to Cath Lab.

## 2020-07-14 ENCOUNTER — Other Ambulatory Visit: Payer: Self-pay

## 2020-07-14 ENCOUNTER — Inpatient Hospital Stay (HOSPITAL_COMMUNITY): Payer: BC Managed Care – PPO

## 2020-07-14 DIAGNOSIS — I2102 ST elevation (STEMI) myocardial infarction involving left anterior descending coronary artery: Secondary | ICD-10-CM

## 2020-07-14 DIAGNOSIS — I4901 Ventricular fibrillation: Secondary | ICD-10-CM

## 2020-07-14 LAB — CBC
HCT: 40.7 % (ref 39.0–52.0)
Hemoglobin: 13.5 g/dL (ref 13.0–17.0)
MCH: 31.5 pg (ref 26.0–34.0)
MCHC: 33.2 g/dL (ref 30.0–36.0)
MCV: 95.1 fL (ref 80.0–100.0)
Platelets: 178 10*3/uL (ref 150–400)
RBC: 4.28 MIL/uL (ref 4.22–5.81)
RDW: 12.9 % (ref 11.5–15.5)
WBC: 8.5 10*3/uL (ref 4.0–10.5)
nRBC: 0 % (ref 0.0–0.2)

## 2020-07-14 LAB — ECHOCARDIOGRAM COMPLETE
Area-P 1/2: 4.6 cm2
Calc EF: 55.7 %
Height: 67 in
S' Lateral: 3.3 cm
Single Plane A2C EF: 57 %
Single Plane A4C EF: 56.3 %
Weight: 3072 oz

## 2020-07-14 LAB — BASIC METABOLIC PANEL
Anion gap: 7 (ref 5–15)
BUN: 20 mg/dL (ref 8–23)
CO2: 23 mmol/L (ref 22–32)
Calcium: 8.2 mg/dL — ABNORMAL LOW (ref 8.9–10.3)
Chloride: 106 mmol/L (ref 98–111)
Creatinine, Ser: 1.33 mg/dL — ABNORMAL HIGH (ref 0.61–1.24)
GFR, Estimated: 59 mL/min — ABNORMAL LOW (ref >60)
Glucose, Bld: 112 mg/dL — ABNORMAL HIGH (ref 70–99)
Potassium: 3.5 mmol/L (ref 3.5–5.1)
Sodium: 136 mmol/L (ref 135–145)

## 2020-07-14 MED ORDER — CHLORHEXIDINE GLUCONATE CLOTH 2 % EX PADS
6.0000 | MEDICATED_PAD | Freq: Every day | CUTANEOUS | Status: DC
  Administered 2020-07-14: 6 via TOPICAL

## 2020-07-14 MED ORDER — NITROGLYCERIN 1 MG/10 ML FOR IR/CATH LAB
INTRA_ARTERIAL | Status: AC
  Filled 2020-07-14: qty 10

## 2020-07-14 NOTE — Progress Notes (Signed)
  Echocardiogram 2D Echocardiogram has been performed.  Randall Jimenez 07/14/2020, 11:51 AM

## 2020-07-14 NOTE — Progress Notes (Signed)
Progress Note  Patient Name: Randall Jimenez Date of Encounter: 07/14/2020  [facility] Cardiologist: None New Excell Seltzer)  Subjective  Feels great.  Has not had any angina pectoris since admission.  No further arrhythmia on monitor. He is puzzled by the development of CAD since he describes a very healthy lifestyle, with daily exercise (walking, gym) and a diet that is low on red meat high in fish and nuts, no family history of premature CAD, non-smoker. That his "bad cholesterol was a little high" at his recent office visit, but medications were not started, since the sample was not fasting.  Inpatient Medications    Scheduled Meds: . aspirin EC  81 mg Oral Daily  . atorvastatin  80 mg Oral Q2000  . carvedilol  3.125 mg Oral BID WC  . Chlorhexidine Gluconate Cloth  6 each Topical Daily  . enoxaparin (LOVENOX) injection  40 mg Subcutaneous Q24H  . sodium chloride flush  3 mL Intravenous Q12H  . ticagrelor  90 mg Oral BID   Continuous Infusions: . sodium chloride 20 mL (07/13/20 1207)  . sodium chloride Stopped (07/13/20 1521)  . sodium chloride    . amiodarone 30 mg/hr (07/14/20 0604)  . sodium chloride     PRN Meds: sodium chloride, acetaminophen, diazepam, nitroGLYCERIN, ondansetron (ZOFRAN) IV, oxyCODONE, sodium chloride flush   Vital Signs    Vitals:   07/14/20 0400 07/14/20 0500 07/14/20 0600 07/14/20 0744  BP: 111/67 104/67 137/77   Pulse: (!) 53 (!) 51 61   Resp: 10 10 10    Temp:    98.4 F (36.9 C)  TempSrc:    Oral  SpO2: 99% 99% 97%   Weight:      Height:        Intake/Output Summary (Last 24 hours) at 07/14/2020 0837 Last data filed at 07/14/2020 0600 Gross per 24 hour  Intake 683.25 ml  Output 475 ml  Net 208.25 ml   Last 3 Weights 07/13/2020  Weight (lbs) 192 lb  Weight (kg) 87.091 kg      Telemetry    Mild sinus bradycardia overnight, sinus rhythm in 70s while awake- Personally Reviewed  ECG    Sinus brady, subtle residual ST  elevation and broad negative T waves across anterior precordium, long QT 502 ms - Personally Reviewed  Physical Exam  Appears healthy, overweight but fit GEN: No acute distress.   Neck: No JVD Cardiac: RRR, no murmurs, rubs, S4 gallop is present Respiratory: Clear to auscultation bilaterally. GI: Soft, nontender, non-distended  MS: No edema; No deformity. Neuro:  Nonfocal  Psych: Normal affect   Labs    High Sensitivity Troponin:   Recent Labs  Lab 07/13/20 1202 07/13/20 1416  TROPONINIHS 41* 598*      Chemistry Recent Labs  Lab 07/13/20 1202 07/13/20 1416 07/14/20 0035  NA 137  --  136  K 3.8  --  3.5  CL 103  --  106  CO2 21*  --  23  GLUCOSE 110*  --  112*  BUN 22  --  20  CREATININE 1.80* 1.56* 1.33*  CALCIUM 9.3  --  8.2*  PROT 7.2  --   --   ALBUMIN 4.4  --   --   AST 32  --   --   ALT 31  --   --   ALKPHOS 55  --   --   BILITOT 1.5*  --   --   GFRNONAA 41* 49* 59*  ANIONGAP 13  --  7     Hematology Recent Labs  Lab 07/13/20 1202 07/13/20 2047 07/14/20 0035  WBC 8.7  --  8.5  RBC 4.84  --  4.28  HGB 15.1  --  13.5  HCT 45.9  --  40.7  MCV 94.8  --  95.1  MCH 31.2  --  31.5  MCHC 32.9  --  33.2  RDW 12.6  --  12.9  PLT 195 189 178    BNPNo results for input(s): BNP, PROBNP in the last 168 hours.   DDimer No results for input(s): DDIMER in the last 168 hours.   Radiology    CARDIAC CATHETERIZATION  Result Date: 07/13/2020  Prox LAD lesion is 99% stenosed.  A drug-eluting stent was successfully placed using a SYNERGY XD 3.50X20.  Post intervention, there is a 0% residual stenosis.  There is mild left ventricular systolic dysfunction.  LV end diastolic pressure is normal.  The left ventricular ejection fraction is 50-55% by visual estimate.  There is no mitral valve regurgitation.  1.  Acute anterior STEMI secondary to thrombotic subtotal occlusion of the proximal LAD with TIMI I flow, treated successfully with primary PCI using a 3.5 x  20 mm Synergy DES 2.  Patent left circumflex (dominant vessel) with no obstructive disease 3.  Patent nondominant RCA with no obstructive disease 4.  Mild hypokinesis of the distal anterolateral and apical walls, LVEF estimated at 50 to 55% 5.  Ventricular fibrillation x3 in the Cath Lab prior to PCI, defibrillated x3, treated also with lidocaine and amiodarone. Recommendations: Continue Aggrastat x2 hours, continue amiodarone until tomorrow morning, initiate post MI medical therapy as tolerated.  Recommend ICU care until tomorrow and as long as hemodynamically stable he can move to a telemetry bed and would be eligible for hospital discharge Monday morning.  Check 2D echo.  Continue aspirin and ticagrelor x12 months without interruption.   DG Chest Port 1 View  Result Date: 07/13/2020 CLINICAL DATA:  Myocardial infarction. EXAM: PORTABLE CHEST 1 VIEW COMPARISON:  None. FINDINGS: The heart size and mediastinal contours are within normal limits. Mild left basilar atelectasis/airspace disease is noted. There is mild pulmonary vascular congestion. There is no pleural effusion or pneumothorax. The visualized skeletal structures are unremarkable. IMPRESSION: Mild left basilar atelectasis/airspace disease. Mild pulmonary vascular congestion. Electronically Signed   By: Romona Curls M.D.   On: 07/13/2020 12:28    Cardiac Studies   07/13/2020  Resting                 Left Heart  Left Ventricle The left ventricular size is normal. There is mild left ventricular systolic dysfunction. LV end diastolic pressure is normal. The left ventricular ejection fraction is 50-55% by visual estimate. There are LV function abnormalities due to segmental dysfunction. There is no evidence of mitral regurgitation.   Coronary Diagrams   Diagnostic Dominance: Left    Intervention     Implants    Permanent Stent   Synergy Xd 3.50x20       Patient Profile     65 y.o. male without major coronary risk  factors presenting with acute anterior wall STEMI due to proximal LAD occlusion, complicated by brief VT arrest, now status post PCI-DES.  Minimally reduced LV systolic function due to anterior wall hypokinesis on LV gram.  The reperfusion was achieved and under 2 hours from symptom onset.  Assessment & Plan    Progressing well.  Completely asymptomatic.  No problems at right radial cath  access site.  Did not have any prodromal angina or other warning signs. Discussed mandatory dual antiplatelet therapy, for 12 months.  High-dose atorvastatin.  Target LDL long-term less than 70.  (Do note exceptionally good HDL, likely due to his active lifestyle).  Encouraged him to consider cardiac rehab, but he exercises daily anyway and is eager to return back to work since it is finals week at [employer]. Will discontinue amiodarone after 24 hours.  Continue carvedilol (dose cannot be increased too much due to bradycardia at night).  Echo today. Probable discharge in the morning if no recurrent arrhythmia or angina.  Thurmon Fair, MD, [facility] [phone] office [phone] pager   For questions or updates, please contact [facility] Please consult [email or web address] for contact info under        Signed, Thurmon Fair, MD  07/14/2020, 8:37 AM

## 2020-07-14 NOTE — Progress Notes (Signed)
Echo complete, lunch finished. Report called Tammy Sours, RN. Pt transferred to bed 6E13 by way of wheelchair. Wife at bedside. VSS.  Delories Heinz, RN

## 2020-07-15 ENCOUNTER — Other Ambulatory Visit (HOSPITAL_COMMUNITY): Payer: Self-pay

## 2020-07-15 ENCOUNTER — Encounter (HOSPITAL_COMMUNITY): Payer: Self-pay | Admitting: Cardiovascular Disease

## 2020-07-15 DIAGNOSIS — I10 Essential (primary) hypertension: Secondary | ICD-10-CM

## 2020-07-15 DIAGNOSIS — E785 Hyperlipidemia, unspecified: Secondary | ICD-10-CM

## 2020-07-15 DIAGNOSIS — E7800 Pure hypercholesterolemia, unspecified: Secondary | ICD-10-CM

## 2020-07-15 MED ORDER — CARVEDILOL 3.125 MG PO TABS
3.1250 mg | ORAL_TABLET | Freq: Two times a day (BID) | ORAL | 11 refills | Status: DC
  Filled 2020-07-15: qty 60, 30d supply, fill #0

## 2020-07-15 MED ORDER — TICAGRELOR 90 MG PO TABS
90.0000 mg | ORAL_TABLET | Freq: Two times a day (BID) | ORAL | 11 refills | Status: DC
  Filled 2020-07-15: qty 60, 30d supply, fill #0

## 2020-07-15 MED ORDER — ATORVASTATIN CALCIUM 80 MG PO TABS
80.0000 mg | ORAL_TABLET | Freq: Every day | ORAL | 3 refills | Status: DC
  Filled 2020-07-15: qty 30, 30d supply, fill #0

## 2020-07-15 MED ORDER — NITROGLYCERIN 0.4 MG SL SUBL
0.4000 mg | SUBLINGUAL_TABLET | SUBLINGUAL | 0 refills | Status: DC | PRN
  Filled 2020-07-15: qty 25, 8d supply, fill #0

## 2020-07-15 MED ORDER — ASPIRIN 81 MG PO TBEC
81.0000 mg | DELAYED_RELEASE_TABLET | Freq: Every day | ORAL | 3 refills | Status: AC
  Filled 2020-07-15: qty 90, 90d supply, fill #0

## 2020-07-15 MED FILL — Amiodarone HCl Inj 150 MG/3ML (50 MG/ML): INTRAVENOUS | Qty: 3 | Status: AC

## 2020-07-15 NOTE — TOC Benefit Eligibility Note (Signed)
Patient Product/process development scientist completed.    The patient is currently admitted and upon discharge could be taking Brilinta 90 mg.  The current 30 day co-pay is, $30.00.   The patient is insured through [employer]     Roland Earl, CPhT Pharmacy Patient Advocate Specialist [facility] Antimicrobial Stewardship Team Direct Number: [phone]  Fax: [phone]

## 2020-07-15 NOTE — Progress Notes (Signed)
CARDIAC REHAB PHASE I   PRE:  Rate/Rhythm: 81 SR  BP:  Supine:   Sitting: 159/87  Standing:    SaO2: 99%RA  MODE:  Ambulation: 1200 ft   POST:  Rate/Rhythm: 88 SR  BP:  Supine:   Sitting: 148/91  Standing:    SaO2: 99%RA 0915-1015 Pt walked 1200 ft on RA with steady gait. No CP and tolerated well. MI education completed with pt who voiced understanding. Stressed importance of brilinta with stent. Reviewed NTG use, MI restrictions, walking for ex, heart healthy food choices, risk factors, and CRP 2. Pt is very active and walks 7 miles daily. He will consider program. Has referral to [place]. Instructions given as to how to increase activity slowly.  Luetta Nutting, RN BSN  07/15/2020 10:07 AM

## 2020-07-15 NOTE — Discharge Summary (Signed)
Discharge Summary    Patient ID: Randall Jimenez; DOB: 1955/09/23  Admit date: 07/13/2020 Discharge date: 07/15/2020  PCP:  Randall Gosselin, MD   [facility] Providers Cardiologist:  Randall Bollman, MD     Discharge Diagnoses    Active Problems:   STEMI involving left anterior descending coronary artery [facility]   Ventricular fibrillation (HCC)   HTN (hypertension)   ST elevation myocardial infarction involving left anterior descending (LAD) coronary artery [facility])   Hyperlipidemia   Diagnostic Studies/Procedures    Cath: 07/13/20   Prox LAD lesion is 99% stenosed.  A drug-eluting stent was successfully placed using a SYNERGY XD 3.50X20.  Post intervention, there is a 0% residual stenosis.  There is mild left ventricular systolic dysfunction.  LV end diastolic pressure is normal.  The left ventricular ejection fraction is 50-55% by visual estimate.  There is no mitral valve regurgitation.   1.  Acute anterior STEMI secondary to thrombotic subtotal occlusion of the proximal LAD with TIMI I flow, treated successfully with primary PCI using a 3.5 x 20 mm Synergy DES 2.  Patent left circumflex (dominant vessel) with no obstructive disease 3.  Patent nondominant RCA with no obstructive disease 4.  Mild hypokinesis of the distal anterolateral and apical walls, LVEF estimated at 50 to 55% 5.  Ventricular fibrillation x3 in the Cath Lab prior to PCI, defibrillated x3, treated also with lidocaine and amiodarone.  Recommendations: Continue Aggrastat x2 hours, continue amiodarone until tomorrow morning, initiate post MI medical therapy as tolerated.  Recommend ICU care until tomorrow and as long as hemodynamically stable he can move to a telemetry bed and would be eligible for hospital discharge Monday morning.  Check 2D echo.  Continue aspirin and ticagrelor x12 months without interruption.  Diagnostic Dominance: Left    Intervention    Echo:  07/14/20  IMPRESSIONS    1. Left ventricular ejection fraction, by estimation, is 55 to 60%. Left  ventricular ejection fraction by 3D volume is 57 %. The left ventricle has  normal function. The left ventricle has no regional wall motion  abnormalities. Left ventricular diastolic  parameters were normal.  2. Right ventricular systolic function is normal. The right ventricular  size is normal.  3. The mitral valve is normal in structure. Trivial mitral valve  regurgitation. No evidence of mitral stenosis.  4. The aortic valve is normal in structure. Aortic valve regurgitation is  not visualized. No aortic stenosis is present.  5. The inferior vena cava is normal in size with greater than 50%  respiratory variability, suggesting right atrial pressure of 3 mmHg.  _____________   History of Present Illness     Randall Jimenez is a 65 y.o. male with no past cardiac history.  The patient is very active.  He walked 7 miles the day prior to admission and he was out doing yard work that morning with no problems.  Stated that he worked outside for a few hours.  After coming inside and drinking some coffee, he developed substernal chest discomfort.  His pain persisted and was associated with diaphoresis.  EMS was called and a code STEMI was diagnosed in the field.  The patient had an anterior ST elevation pattern.  On arrival to the emergency room, while waiting for the Cath Lab team arrival, the patient had a ventricular fibrillation arrest.  He required about 1 minute of CPR and 1 shock.  He was treated with 300 mg of amiodarone.  On Dr. Earmon Phoenix arrival  in the emergency department, the patient was awake and alert.  His chest pain had resolved.  He had no other complaints.  He specifically denied any associated nausea, vomiting, or diaphoresis.  He has had no recent exertional symptoms.  The patient only takes 1 medication for high blood pressure. He does not smoke.  He denied any family  history of premature CAD.  Hospital Course     1. Acute anterior STEMI: Underwent cardiac catheterization noted above with thrombotic subtotal occlusion of the proximal LAD successfully treated with PCI/DES x1.  Patent left circumflex and patent nondominant RCA.  He was continued on Aggrastat for 2 hours.  Plan for DAPT with aspirin/Brilinta for 1 year.  Seen and worked well with cardiac rehab post cath.  Follow-up echocardiogram showed an EF of 55 to 60% with no regional wall motion abnormalities. -- Continue aspirin, Brilinta, statin and Coreg 3.125 twice daily  2. VF arrest: Initially required CPR and defibrillation in the ER while waiting to go to the Cath Lab.  Had subsequent defibrillation x3 while in the Cath Lab prior to PCI.  He was treated with lidocaine and amiodarone drips.  This was stopped 24 hours later as he had stable rhythm. He did not require intubation.  -- coreg 3.125mg  BID, unable to further titrate given bradycardia.   3. HTN: stable with coreg 3.125mg  BID. Resume lisinopril 5mg  daily at discharge.   4. HLD: LDL 119, HDL 75 -- now on high dose statin -- FLP/LFTs in 8 weeks  General: Well developed, well nourished, male appearing in no acute distress. Head: Normocephalic, atraumatic.  Neck: Supple without bruits, JVD. Lungs:  Resp regular and unlabored, CTA. Heart: RRR, S1, S2, no S3, S4, or murmur; no rub. Abdomen: Soft, non-tender, non-distended with normoactive bowel sounds. No hepatomegaly. No rebound/guarding. No obvious abdominal masses. Extremities: No clubbing, cyanosis, edema. Distal pedal pulses are 2+ bilaterally. Right radial cath site stable without bruising or hematoma Neuro: Alert and oriented X 3. Moves all extremities spontaneously. Psych: Normal affect.  Patient was seen by Dr. and deemed stable for discharge home. Follow up in the office has been arranged. Medications sent to pharmacy of choice. Educated by PharmD prior to discharge.   Did  the patient have an acute coronary syndrome (MI, NSTEMI, STEMI, etc) this admission?:  Yes                               AHA/ACC Clinical Performance & Quality Measures: 1. Aspirin prescribed? - Yes 2. ADP Receptor Inhibitor (Plavix/Clopidogrel, Brilinta/Ticagrelor or Effient/Prasugrel) prescribed (includes medically managed patients)? - Yes 3. Beta Blocker prescribed? - Yes 4. High Intensity Statin (Lipitor 40-80mg  or Crestor 20-40mg ) prescribed? - Yes 5. EF assessed during THIS hospitalization? - Yes 6. For EF <40%, was ACEI/ARB prescribed? - Not Applicable (EF >/= 40%) 7. For EF <40%, Aldosterone Antagonist (Spironolactone or Eplerenone) prescribed? - Not Applicable (EF >/= 40%) 8. Cardiac Rehab Phase II ordered (including medically managed patients)? - Yes ____________  Discharge Vitals Blood pressure (!) 148/91, pulse 87, temperature 97.8 F (36.6 C), temperature source Oral, resp. rate 18, height 5\' 7"  (1.702 m), weight 87.1 kg, SpO2 98 %.  Filed Weights   07/13/20 1205  Weight: 87.1 kg    Labs & Radiologic Studies    CBC Recent Labs    07/13/20 1202 07/13/20 2047 07/14/20 0035  WBC 8.7  --  8.5  NEUTROABS 4.7  --   --  HGB 15.1  --  13.5  HCT 45.9  --  40.7  MCV 94.8  --  95.1  PLT 195 189 178   Basic Metabolic Panel Recent Labs    16/10/96 1202 07/13/20 1416 07/14/20 0035  NA 137  --  136  K 3.8  --  3.5  CL 103  --  106  CO2 21*  --  23  GLUCOSE 110*  --  112*  BUN 22  --  20  CREATININE 1.80* 1.56* 1.33*  CALCIUM 9.3  --  8.2*   Liver Function Tests Recent Labs    07/13/20 1202  AST 32  ALT 31  ALKPHOS 55  BILITOT 1.5*  PROT 7.2  ALBUMIN 4.4   No results for input(s): LIPASE, AMYLASE in the last 72 hours. High Sensitivity Troponin:   Recent Labs  Lab 07/13/20 1202 07/13/20 1416  TROPONINIHS 41* 598*    BNP Invalid input(s): POCBNP D-Dimer No results for input(s): DDIMER in the last 72 hours. Hemoglobin A1C Recent Labs     07/13/20 1202  HGBA1C 5.7*   Fasting Lipid Panel Recent Labs    07/13/20 1202  CHOL 212*  HDL 75  LDLCALC 119*  TRIG 92  CHOLHDL 2.8   Thyroid Function Tests No results for input(s): TSH, T4TOTAL, T3FREE, THYROIDAB in the last 72 hours.  Invalid input(s): FREET3 _____________  CARDIAC CATHETERIZATION  Result Date: 07/13/2020  Prox LAD lesion is 99% stenosed.  A drug-eluting stent was successfully placed using a SYNERGY XD 3.50X20.  Post intervention, there is a 0% residual stenosis.  There is mild left ventricular systolic dysfunction.  LV end diastolic pressure is normal.  The left ventricular ejection fraction is 50-55% by visual estimate.  There is no mitral valve regurgitation.  1.  Acute anterior STEMI secondary to thrombotic subtotal occlusion of the proximal LAD with TIMI I flow, treated successfully with primary PCI using a 3.5 x 20 mm Synergy DES 2.  Patent left circumflex (dominant vessel) with no obstructive disease 3.  Patent nondominant RCA with no obstructive disease 4.  Mild hypokinesis of the distal anterolateral and apical walls, LVEF estimated at 50 to 55% 5.  Ventricular fibrillation x3 in the Cath Lab prior to PCI, defibrillated x3, treated also with lidocaine and amiodarone. Recommendations: Continue Aggrastat x2 hours, continue amiodarone until tomorrow morning, initiate post MI medical therapy as tolerated.  Recommend ICU care until tomorrow and as long as hemodynamically stable he can move to a telemetry bed and would be eligible for hospital discharge Monday morning.  Check 2D echo.  Continue aspirin and ticagrelor x12 months without interruption.   DG Chest Port 1 View  Result Date: 07/13/2020 CLINICAL DATA:  Myocardial infarction. EXAM: PORTABLE CHEST 1 VIEW COMPARISON:  None. FINDINGS: The heart size and mediastinal contours are within normal limits. Mild left basilar atelectasis/airspace disease is noted. There is mild pulmonary vascular congestion. There is  no pleural effusion or pneumothorax. The visualized skeletal structures are unremarkable. IMPRESSION: Mild left basilar atelectasis/airspace disease. Mild pulmonary vascular congestion. Electronically Signed   By: Romona Curls M.D.   On: 07/13/2020 12:28   ECHOCARDIOGRAM COMPLETE  Result Date: 07/14/2020    ECHOCARDIOGRAM REPORT   Patient Name:   CIRILO Teater Date of Exam: 07/14/2020 Medical Rec #:  [id number]               Height:       67.0 in Accession #:    [id number]  Weight:       192.0 lb Date of Birth:  [birth year]                BSA:          1.987 m Patient Age:    65 years                BP:           123/70 mmHg Patient Gender: M                       HR:           57 bpm. Exam Location:  Inpatient Procedure: 2D Echo, 3D Echo, Cardiac Doppler and Color Doppler Indications:    121-121.4 ST elevation (STEMI) and non-ST elevation (NSTEMI)                 myocardial infarction  History:        Patient has no prior history of Echocardiogram examinations.                 Acute MI and CAD, Abnormal ECG, Arrythmias:Ventricular                 Fibrillation, Signs/Symptoms:Chest Pain; Risk                 Factors:Hypertension.  Sonographer:    Sheralyn Boatman RDCS Referring Phys: [phone] MICHAEL COOPER IMPRESSIONS  1. Left ventricular ejection fraction, by estimation, is 55 to 60%. Left ventricular ejection fraction by 3D volume is 57 %. The left ventricle has normal function. The left ventricle has no regional wall motion abnormalities. Left ventricular diastolic  parameters were normal.  2. Right ventricular systolic function is normal. The right ventricular size is normal.  3. The mitral valve is normal in structure. Trivial mitral valve regurgitation. No evidence of mitral stenosis.  4. The aortic valve is normal in structure. Aortic valve regurgitation is not visualized. No aortic stenosis is present.  5. The inferior vena cava is normal in size with greater than 50% respiratory variability,  suggesting right atrial pressure of 3 mmHg. FINDINGS  Left Ventricle: Left ventricular ejection fraction, by estimation, is 55 to 60%. Left ventricular ejection fraction by 3D volume is 57 %. The left ventricle has normal function. The left ventricle has no regional wall motion abnormalities. The left ventricular internal cavity size was normal in size. There is no left ventricular hypertrophy. Left ventricular diastolic parameters were normal. Normal left ventricular filling pressure. Right Ventricle: The right ventricular size is normal. No increase in right ventricular wall thickness. Right ventricular systolic function is normal. Left Atrium: Left atrial size was normal in size. Right Atrium: Right atrial size was normal in size. Pericardium: There is no evidence of pericardial effusion. Mitral Valve: The mitral valve is normal in structure. Trivial mitral valve regurgitation. No evidence of mitral valve stenosis. Tricuspid Valve: The tricuspid valve is normal in structure. Tricuspid valve regurgitation is not demonstrated. No evidence of tricuspid stenosis. Aortic Valve: The aortic valve is normal in structure. Aortic valve regurgitation is not visualized. No aortic stenosis is present. Pulmonic Valve: The pulmonic valve was normal in structure. Pulmonic valve regurgitation is not visualized. No evidence of pulmonic stenosis. Aorta: The aortic root is normal in size and structure. Venous: The inferior vena cava is normal in size with greater than 50% respiratory variability, suggesting right atrial pressure of 3 mmHg. IAS/Shunts: No atrial level shunt detected by  color flow Doppler.  LEFT VENTRICLE PLAX 2D LVIDd:         4.80 cm         Diastology LVIDs:         3.30 cm         LV e' medial:    8.05 cm/s LV PW:         0.90 cm         LV E/e' medial:  7.7 LV IVS:        0.90 cm         LV e' lateral:   14.60 cm/s LVOT diam:     2.20 cm         LV E/e' lateral: 4.2 LV SV:         73 LV SV Index:   37 LVOT Area:      3.80 cm        3D Volume EF                                LV 3D EF:    Left                                             ventricular LV Volumes (MOD)                            ejection LV vol d, MOD    86.2 ml                    fraction by A2C:                                        3D volume LV vol d, MOD    69.5 ml                    is 57 %. A4C: LV vol s, MOD    37.1 ml A2C:                           3D Volume EF: LV vol s, MOD    30.4 ml       3D EF:        57 % A4C: LV SV MOD A2C:   49.1 ml LV SV MOD A4C:   69.5 ml LV SV MOD BP:    43.3 ml RIGHT VENTRICLE             IVC RV S prime:     11.00 cm/s  IVC diam: 2.00 cm TAPSE (M-mode): 2.8 cm LEFT ATRIUM           Index       RIGHT ATRIUM          Index LA diam:      3.00 cm 1.51 cm/m  RA Area:     9.91 cm LA Vol (A2C): 22.9 ml 11.52 ml/m RA Volume:   15.60 ml 7.85 ml/m LA Vol (A4C): 30.3 ml 15.25 ml/m  AORTIC VALVE LVOT Vmax:   102.00 cm/s LVOT Vmean:  76.300 cm/s LVOT VTI:    0.193 m  AORTA  Ao Root diam: 3.50 cm Ao Asc diam:  3.50 cm MITRAL VALVE MV Area (PHT): 4.60 cm    SHUNTS MV Decel Time: 165 msec    Systemic VTI:  0.19 m MV E velocity: 61.70 cm/s  Systemic Diam: 2.20 cm MV A velocity: 50.00 cm/s MV E/A ratio:  1.23 Mihai Croitoru MD Electronically signed by Thurmon Fair MD Signature Date/Time: 07/14/2020/1:08:46 PM    Final    Disposition   Pt is being discharged home today in good condition.  Follow-up Plans & Appointments     Follow-up Information    Ronney Asters, NP Follow up on 07/23/2020.   Specialty: Cardiology Why: at 11:45am for your follow up appt Contact information: [address] [place] [address] [phone]              Discharge Instructions    Amb Referral to Cardiac Rehabilitation   Complete by: As directed    Diagnosis:  Coronary Stents STEMI     After initial evaluation and assessments completed: Virtual Based Care may be provided alone or in conjunction with Phase 2 Cardiac  Rehab based on patient barriers.: Yes   Call MD for:  difficulty breathing, headache or visual disturbances   Complete by: As directed    Call MD for:  persistant dizziness or light-headedness   Complete by: As directed    Call MD for:  redness, tenderness, or signs of infection (pain, swelling, redness, odor or green/yellow discharge around incision site)   Complete by: As directed    Diet - low sodium heart healthy   Complete by: As directed    Discharge instructions   Complete by: As directed    Radial Site Care Refer to this sheet in the next few weeks. These instructions provide you with information on caring for yourself after your procedure. Your caregiver may also give you more specific instructions. Your treatment has been planned according to current medical practices, but problems sometimes occur. Call your caregiver if you have any problems or questions after your procedure. HOME CARE INSTRUCTIONS You may shower the day after the procedure.Remove the bandage (dressing) and gently wash the site with plain soap and water.Gently pat the site dry.  Do not apply powder or lotion to the site.  Do not submerge the affected site in water for 3 to 5 days.  Inspect the site at least twice daily.  Do not flex or bend the affected arm for 24 hours.  No lifting over 5 pounds (2.3 kg) for 5 days after your procedure.  Do not drive home if you are discharged the same day of the procedure. Have someone else drive you.  You may drive 24 hours after the procedure unless otherwise instructed by your caregiver.  What to expect: Any bruising will usually fade within 1 to 2 weeks.  Blood that collects in the tissue (hematoma) may be painful to the touch. It should usually decrease in size and tenderness within 1 to 2 weeks.  SEEK IMMEDIATE MEDICAL CARE IF: You have unusual pain at the radial site.  You have redness, warmth, swelling, or pain at the radial site.  You have drainage (other than a  small amount of blood on the dressing).  You have chills.  You have a fever or persistent symptoms for more than 72 hours.  You have a fever and your symptoms suddenly get worse.  Your arm becomes pale, cool, tingly, or numb.  You have heavy bleeding from  the site. Hold pressure on the site.   PLEASE DO NOT MISS ANY DOSES OF YOUR BRILINTA!!!!! Also keep a log of you blood pressures and bring back to your follow up appt. Please call the office with any questions.   Patients taking blood thinners should generally stay away from medicines like ibuprofen, Advil, Motrin, naproxen, and Aleve due to risk of stomach bleeding. You may take Tylenol as directed or talk to your primary doctor about alternatives.  PLEASE ENSURE THAT YOU DO NOT RUN OUT OF YOUR BRILINTA/PLAVIX. This medication is very important to remain on for at least one year. IF you have issues obtaining this medication due to cost please CALL the office 3-5 business days prior to running out in order to prevent missing doses of this medication.   Increase activity slowly   Complete by: As directed       Discharge Medications   Allergies as of 07/15/2020      Reactions   Penicillins Itching      Medication List    TAKE these medications   Aspirin Low Dose 81 MG EC tablet Generic drug: aspirin Take 1 tablet (81 mg total) by mouth daily. Swallow whole.   atorvastatin 80 MG tablet Commonly known as: LIPITOR Take 1 tablet (80 mg total) by mouth daily at 8 pm.   Brilinta 90 MG Tabs tablet Generic drug: ticagrelor Take 1 tablet (90 mg total) by mouth 2 (two) times daily.   carvedilol 3.125 MG tablet Commonly known as: COREG Take 1 tablet (3.125 mg total) by mouth 2 (two) times daily with a meal.   lisinopril 5 MG tablet Commonly known as: ZESTRIL Take 5 mg by mouth daily.   nitroGLYCERIN 0.4 MG SL tablet Commonly known as: NITROSTAT Place 1 tablet (0.4 mg total) under the tongue every 5 (five) minutes x 3 doses as  needed for chest pain.       Outstanding Labs/Studies   FLP/LFTs in 8 weeks  Duration of Discharge Encounter   Greater than 30 minutes including physician time.  Signed, Laverda Page, NP 07/15/2020, 11:50 AM     Patient seen and examined. Agree with assessment and plan.  He feels well.  The patient has been very active and has eaten a healthy diet.  The evening before his acute coronary event he had walked 7 miles.  The morning of his event he had worked over 2 hours in the yard.  Suspect he had soft obstructive inflammatory plaque which led to plaque rupture leading to subacute thrombotic occlusion of his proximal LAD which was successfully intervened upon.  I discussed the pathophysiology with he and his wife.  We will continue DAPT with aspirin/Brilinta for minimum of 12 months.  Aggressive lipid-lowering therapy with target LDL less than 70.  On low-dose ACE inhibition in addition to carvedilol.  Echo yesterday shows a normal LV function with EF at 55 to 60% out wall motion abnormality.  At the time of his presentation the LAD was subtotally and not totally occluded.  Suspect complete normalization of LV function.  We will plan discharge today follow-up with Dr. Excell Seltzer.   Lennette Bihari, MD, [facility] 07/15/2020 11:50 AM

## 2020-07-15 NOTE — TOC Transition Note (Signed)
Transition of Care [facility]) - CM/SW Discharge Note   Patient Details  Name: Garlin Batdorf MRN: [id number] Date of Birth: January 10, 1956  [facility] of [facility] CM/SW Contact:  Leone Haven, RN Phone Number: 07/15/2020, 12:03 PM   Clinical Narrative:    Patient is for dc today, NCM spoke with patient about his copay amt of 30.00 for brilinta.  He states he can afford that.     Final next level of care: Home/Self Care Barriers to Discharge: No Barriers Identified   Patient Goals and CMS Choice     Choice offered to / list presented to : NA  Discharge Placement                       Discharge Plan and Services                  DME Agency: NA                  Social Determinants of Health (SDOH) Interventions     Readmission Risk Interventions No flowsheet data found.

## 2020-07-16 ENCOUNTER — Other Ambulatory Visit (HOSPITAL_COMMUNITY): Payer: Self-pay

## 2020-07-16 ENCOUNTER — Telehealth (HOSPITAL_BASED_OUTPATIENT_CLINIC_OR_DEPARTMENT_OTHER): Payer: Self-pay

## 2020-07-16 ENCOUNTER — Other Ambulatory Visit (HOSPITAL_BASED_OUTPATIENT_CLINIC_OR_DEPARTMENT_OTHER): Payer: Self-pay

## 2020-07-16 ENCOUNTER — Telehealth: Payer: Self-pay | Admitting: Cardiovascular Disease

## 2020-07-16 NOTE — Telephone Encounter (Signed)
Pt was recently released from the hospital, he has some concerns about his discharge instructions that need to be addressed. Some of the meds that he was told not to take are on his discharge paperwork for him to continue to take.  Please advise

## 2020-07-16 NOTE — Telephone Encounter (Signed)
The patient reports he was taking lisinopril 5 mg daily prior to his hospital stay. Post STEMI, ASA, Brilinta, Coreg, atorva and NTG were added to his regimen. He states one of the doctors who came in during his admission told him not to take lisinopril anymore, but it is still on his med list.  D/c summary requests the patient to continue lisinopril. He has not checked his BP since discharge, but prior to STEMI his blood pressure was running about 100/70s.  He held his lisinopril this morning and would like Dr. Earmon Phoenix instructions on whether or not to restart.

## 2020-07-16 NOTE — Telephone Encounter (Signed)
Pharmacy Transitions of Care Follow-up Telephone Call  Date of discharge: 07/15/20 Discharge Diagnosis: STEMI  How have you been since you were released from the hospital? Patient reports feeling back to normal with no issues.   Medication changes made at discharge:  - START: Brilinta, carvedilol, low dose aspirin, atorvastatin, and Nitrostat  Medication changes verified by the patient? Yes    Medication Accessibility:  [facility] [phone]  Was the patient provided with refills on discharged medications? Yes  Have all prescriptions been transferred from [facility] to home pharmacy? Yes  Is the patient able to afford medications? Yes . Notable copays: $30/month for Brilinta     Medication Review: TICAGRELOR (BRILINTA) Ticagrelor 90 mg BID initiated on 07/15/2020.  - Educated patient on expected duration of therapy of aspirin with ticagrelor.  - Discussed importance of taking medication around the same time every day, - Reviewed potential DDIs with patient - Advised patient of medications to avoid (NSAIDs, aspirin maintenance doses>100 mg daily) - Educated that Tylenol (acetaminophen) will be the preferred analgesic to prevent risk of bleeding  - Emphasized importance of monitoring for signs and symptoms of bleeding (abnormal bruising, prolonged bleeding, nose bleeds, bleeding from gums, discolored urine, black tarry stools)  - Educated patient to notify doctor if shortness of breath or abnormal heartbeat occur - Advised patient to alert all providers of antiplatelet therapy prior to starting a new medication or having a procedure   Follow-up Appointments:  [facility] f/u appt confirmed? Yes Scheduled for f/u on 07/23/20.   If their condition worsens, is the pt aware to call PCP or go to the Emergency Dept.? Yes  Final Patient Assessment: Patient feels a little uncomfortable with side effects of medications. He was educated on what  adverse reactions to expect. Medications were transferred to his home pharmacy. Following up with doctor regarding whether he will be continuing lisinopril, alongside carvedilol.

## 2020-07-17 ENCOUNTER — Other Ambulatory Visit (HOSPITAL_COMMUNITY): Payer: Self-pay

## 2020-07-19 ENCOUNTER — Telehealth (HOSPITAL_COMMUNITY): Payer: Self-pay

## 2020-07-19 NOTE — Telephone Encounter (Signed)
He has an appointment with Edd Fabian 5/17. I recommend he hold lisinopril until he sees him so that his BP readings can be reviewed. thanks.

## 2020-07-19 NOTE — Telephone Encounter (Signed)
Left message to continue current medications as he is taking and medications will be revisited at upcoming visit on Tuesday. Instructed the patient to call with questions or concerns.

## 2020-07-19 NOTE — Telephone Encounter (Signed)
Called pt to see if he was interested in the cardiac rehab program, pt stated that he is not interested right now, advised pt to call back if anything changes. Closed referral.

## 2020-07-19 NOTE — Telephone Encounter (Signed)
Pt insurance is active and benefits verified through [employer] Co-pay 0, DED $1,250/0 met, out of pocket $4,890/$50.78 met, co-insurance 20%. no pre-authorization required. Passport, 07/19/2020@3 :17pm, REF# [id number]  Will contact patient to see if he is interested in the Cardiac Rehab Program. If interested, patient will need to complete follow up appt. Once completed, patient will be contacted for scheduling upon review by the RN Navigator.

## 2020-07-22 NOTE — Progress Notes (Signed)
Cardiology Clinic Note   Patient Name: Randall Jimenez Date of Encounter: 07/23/2020  Primary Care Provider:  Catha Gosselin, MD Primary Cardiologist:  Tonny Bollman, MD  Patient Profile    Randall Jimenez 65 year old male presents the clinic today for follow-up evaluation of his STEMI involving his LAD.  Past Medical History    Past Medical History:  Diagnosis Date  . Hyperlipidemia   . Hypertension    Past Surgical History:  Procedure Laterality Date  . CORONARY/GRAFT ACUTE MI REVASCULARIZATION N/A 07/13/2020   Procedure: Coronary/Graft Acute MI Revascularization;  Surgeon: Tonny Bollman, MD;  Location: [facility] INVASIVE CV LAB;  Service: Cardiovascular;  Laterality: N/A;  . LEFT HEART CATH AND CORONARY ANGIOGRAPHY N/A 07/13/2020   Procedure: LEFT HEART CATH AND CORONARY ANGIOGRAPHY;  Surgeon: Tonny Bollman, MD;  Location: [facility] INVASIVE CV LAB;  Service: Cardiovascular;  Laterality: N/A;    Allergies  Allergies  Allergen Reactions  . Penicillins Itching    History of Present Illness    Randall Jimenez has a PMH of coronary artery disease, ventricular fibrillation, hypertension, and hyperlipidemia.  He presented to the hospital on 07/13/2020 and was discharged on 07/15/2020.  He reported he had walked 7 miles the day prior to his admission and was out doing yard work that morning.  After returning inside to drink some coffee he develops substernal chest discomfort.  His pain persisted and was associated with diaphoresis.  EMS was contacted and code STEMI was diagnosed in the field.  He was noted to have anterior ST elevation on his EKG.  On arrival to the emergency room while waiting for transport to the Cath Lab he had an episode of ventricular fibrillation.  He received CPR x1 minute and received 1 shock.  He was treated with 3 mg of amiodarone.  He was taken to the cardiac catheterization lab.  His cardiac catheterization showed a subtotal occlusion of his  proximal LAD which was treated with PCI and DES x1.  He received Aggrastat for 2 hours and was placed on aspirin/Brilinta.  His follow-up echocardiogram showed an EF of 55-60% with no wall and regional wall motion abnormalities.  He presents to the clinic today for follow-up evaluation states he feels well.  We reviewed his medication list and he denies side effects.  He reports compliance.  We reviewed his angiography and stent placement.  He and his wife expressed understanding.  We reviewed the importance of continued heart healthy diet and maintaining his physical activity.  I will have his fasting lipids and LFTs repeated in 6 weeks, give him salty 6 diet sheet, have him maintain his physical activity, and follow-up with Dr. Excell Seltzer in 3 to 4 months.  Today he denies chest pain, shortness of breath, lower extremity edema, fatigue, palpitations, melena, hematuria, hemoptysis, diaphoresis, weakness, presyncope, syncope, orthopnea, and PND.   Home Medications    Prior to Admission medications   Medication Sig Start Date End Date Taking? Authorizing Provider  aspirin 81 MG EC tablet Take 1 tablet (81 mg total) by mouth daily. Swallow whole. 07/15/20   Tonny Bollman, MD  atorvastatin (LIPITOR) 80 MG tablet Take 1 tablet (80 mg total) by mouth daily at 8 pm. 07/15/20   Tonny Bollman, MD  carvedilol (COREG) 3.125 MG tablet Take 1 tablet (3.125 mg total) by mouth 2 (two) times daily with a meal. 07/15/20   Tonny Bollman, MD  lisinopril (ZESTRIL) 5 MG tablet Take 5 mg by mouth daily.    [provider]  nitroGLYCERIN (NITROSTAT) 0.4 MG SL tablet Place 1 tablet (0.4 mg total) under the tongue every 5 (five) minutes x 3 doses as needed for chest pain. 07/15/20   Tonny Bollman, MD  ticagrelor (BRILINTA) 90 MG TABS tablet Take 1 tablet (90 mg total) by mouth 2 (two) times daily. 07/15/20   Tonny Bollman, MD    Family History    Family History  Problem Relation Age of Onset  . CAD Neg Hx     He indicated that the status of his neg hx is unknown.  Social History    Social History   Socioeconomic History  . Marital status: Married    Spouse name: Not on file  . Number of children: Not on file  . Years of education: Not on file  . Highest education level: Not on file  Occupational History  . Not on file  Tobacco Use  . Smoking status: Never Smoker  . Smokeless tobacco: Never Used  Substance and Sexual Activity  . Alcohol use: Not on file  . Drug use: Never  . Sexual activity: Not on file  Other Topics Concern  . Not on file  Social History Narrative  . Not on file   Social Determinants of Health   Financial Resource Strain: Not on file  Food Insecurity: Not on file  Transportation Needs: Not on file  Physical Activity: Not on file  Stress: Not on file  Social Connections: Not on file  Intimate Partner Violence: Not on file     Review of Systems    General:  No chills, fever, night sweats or weight changes.  Cardiovascular:  No chest pain, dyspnea on exertion, edema, orthopnea, palpitations, paroxysmal nocturnal dyspnea. Dermatological: No rash, lesions/masses Respiratory: No cough, dyspnea Urologic: No hematuria, dysuria Abdominal:   No nausea, vomiting, diarrhea, bright red blood per rectum, melena, or hematemesis Neurologic:  No visual changes, wkns, changes in mental status. All other systems reviewed and are otherwise negative except as noted above.  Physical Exam    VS:  BP 122/80 (BP Location: Left Arm, Patient Position: Sitting, Cuff Size: Normal)   Pulse (!) 58   Ht 5\' 7"  (1.702 m)   Wt 189 lb 9.6 oz (86 kg)   SpO2 97%   BMI 29.70 kg/m  , BMI Body mass index is 29.7 kg/m. GEN: Well nourished, well developed, in no acute distress. HEENT: normal. Neck: Supple, no JVD, carotid bruits, or masses. Cardiac: RRR, no murmurs, rubs, or gallops. No clubbing, cyanosis, edema.  Radials/DP/PT 2+ and equal bilaterally.  Respiratory:  Respirations  regular and unlabored, clear to auscultation bilaterally. GI: Soft, nontender, nondistended, BS + x 4. MS: no deformity or atrophy. Skin: warm and dry, no rash.  Right radial cath site clean dry intact no drainage. Neuro:  Strength and sensation are intact. Psych: Normal affect.  Accessory Clinical Findings    Recent Labs: 07/13/2020: ALT 31 07/14/2020: BUN 20; Creatinine, Ser 1.33; Hemoglobin 13.5; Platelets 178; Potassium 3.5; Sodium 136   Recent Lipid Panel    Component Value Date/Time   CHOL 212 (H) 07/13/2020 1202   TRIG 92 07/13/2020 1202   HDL 75 07/13/2020 1202   CHOLHDL 2.8 07/13/2020 1202   VLDL 18 07/13/2020 1202   LDLCALC 119 (H) 07/13/2020 1202    ECG personally reviewed by me today-sinus bradycardia 54 bpm  Echocardiogram 07/14/2020 IMPRESSIONS    1. Left ventricular ejection fraction, by estimation, is 55 to 60%. Left  ventricular ejection fraction by 3D  volume is 57 %. The left ventricle has  normal function. The left ventricle has no regional wall motion  abnormalities. Left ventricular diastolic  parameters were normal.  2. Right ventricular systolic function is normal. The right ventricular  size is normal.  3. The mitral valve is normal in structure. Trivial mitral valve  regurgitation. No evidence of mitral stenosis.  4. The aortic valve is normal in structure. Aortic valve regurgitation is  not visualized. No aortic stenosis is present.  5. The inferior vena cava is normal in size with greater than 50%  respiratory variability, suggesting right atrial pressure of 3 mmHg.  Cath: 07/13/20   Prox LAD lesion is 99% stenosed.  A drug-eluting stent was successfully placed using a SYNERGY XD 3.50X20.  Post intervention, there is a 0% residual stenosis.  There is mild left ventricular systolic dysfunction.  LV end diastolic pressure is normal.  The left ventricular ejection fraction is 50-55% by visual estimate.  There is no mitral valve  regurgitation.  1. Acute anterior STEMI secondary to thrombotic subtotal occlusion of the proximal LAD with TIMI I flow, treated successfully with primary PCI using a 3.5 x 20 mm Synergy DES 2. Patent left circumflex (dominant vessel) with no obstructive disease 3. Patent nondominant RCA with no obstructive disease 4. Mild hypokinesis of the distal anterolateral and apical walls, LVEF estimated at 50 to 55% 5. Ventricular fibrillation x3 in the Cath Lab prior to PCI, defibrillated x3, treated also with lidocaine and amiodarone.  Recommendations: Continue Aggrastat x2 hours, continue amiodarone until tomorrow morning, initiate post MI medical therapy as tolerated. Recommend ICU care until tomorrow and as long as hemodynamically stable he can move to a telemetry bed and would be eligible for hospital discharge Monday morning. Check 2D echo. Continue aspirin and ticagrelor x12 months without interruption.  Diagnostic Dominance: Left    Intervention      Assessment & Plan   1.  STEMI- no chest pain today.  EKG today shows sinus bradycardia 54 bpm.  Underwent cardiac catheterization on 07/13/2020 with PCI/DES x1 to his proximal LAD, thrombotic subtotal occlusion was noted.  He was noted to have patent left circumflex and nondominant RCA.  Post cath echocardiogram showed an EF of 55 to 60% with no regional wall abnormalities. Continue aspirin, Brilinta, atorvastatin, carvedilol Heart healthy low-sodium diet-salty 6 given Increase physical activity as tolerated   Essential hypertension-BP today 122/80.  Well-controlled at home. Continue carvedilol, lisinopril Heart healthy low-sodium diet-salty 6 given Increase physical activity as tolerated  Hyperlipidemia-07/13/2020: Cholesterol 212; HDL 75; LDL Cholesterol 119; Triglycerides 92; VLDL 18 Continue atorvastatin Heart healthy low-sodium high-fiber diet Increase physical activity as tolerated Repeat fasting lipids and LFTs in 6  weeks.  Ventricular fibrillation- no further episodes of accelerated or irregular heart rate.  While waiting to be transferred to the Cath Lab had an episode of VF arrest which required CPR and defibrillation.  Had subsequent defibrillation x3 while in Cath Lab prior to PCI.  He was treated with lidocaine and amiodarone.  Disposition: Follow-up with Dr. Excell Seltzer in 3-4 months.   Randall Jimenez. Emonii Wienke NP-C    07/23/2020, 12:30 PM [facility] [address] Office [phone] Fax [phone]  Notice: This dictation was prepared with Dragon dictation along with smaller phrase technology. Any transcriptional errors that result from this process are unintentional and may not be corrected upon review.  I spent 15 minutes examining this patient, reviewing medications, and using patient centered shared  decision making involving her cardiac care.  Prior to her visit I spent greater than 20 minutes reviewing her past medical history,  medications, and prior cardiac tests.

## 2020-07-23 ENCOUNTER — Other Ambulatory Visit: Payer: Self-pay

## 2020-07-23 ENCOUNTER — Ambulatory Visit: Payer: BC Managed Care – PPO | Admitting: General Practice

## 2020-07-23 ENCOUNTER — Encounter: Payer: Self-pay | Admitting: General Practice

## 2020-07-23 VITALS — BP 122/80 | HR 58 | Ht 67.0 in | Wt 189.6 lb

## 2020-07-23 DIAGNOSIS — I4901 Ventricular fibrillation: Secondary | ICD-10-CM

## 2020-07-23 DIAGNOSIS — E782 Mixed hyperlipidemia: Secondary | ICD-10-CM | POA: Diagnosis not present

## 2020-07-23 DIAGNOSIS — Z79899 Other long term (current) drug therapy: Secondary | ICD-10-CM

## 2020-07-23 DIAGNOSIS — I2102 ST elevation (STEMI) myocardial infarction involving left anterior descending coronary artery: Secondary | ICD-10-CM

## 2020-07-23 DIAGNOSIS — I10 Essential (primary) hypertension: Secondary | ICD-10-CM

## 2020-07-23 NOTE — Patient Instructions (Signed)
Medication Instructions:  The current medical regimen is effective;  continue present plan and medications as directed. Please refer to the Current Medication list given to you today.; *If you need a refill on your cardiac medications before your next appointment, please call your pharmacy*  Lab Work: FASTING LIPID AND LFT-IN 6 WEEKS (June 28th) If you have labs (blood work) drawn today and your tests are completely normal, you will receive your results only by:  MyChart Message (if you have MyChart) OR A paper copy in the mail.  If you have any lab test that is abnormal or we need to change your treatment, we will call you to review the results. You may go to any [facility] that is convenient for you however, we do have a lab in our office that is able to assist you. You DO NOT need an appointment for our lab. The lab is open 8:00am and closes at 4:00pm. Lunch 12:45 - 1:45pm.  Special Instructions PLEASE READ AND FOLLOW SALTY 6-ATTACHED-1,800 mg daily  PLEASE INCREASE PHYSICAL ACTIVITY AS TOLERATED  Follow-Up: Your next appointment:  3-4 month(s) In Person with Tonny Bollman, MD OR IF UNAVAILABLE JESSE CLEAVER, FNP-C  At [facility], you and your health needs are our priority.  As part of our continuing mission to provide you with exceptional heart care, we have created designated Provider Care Teams.  These Care Teams include your primary Cardiologist (physician) and Advanced Practice Providers (APPs -  Physician Assistants and Nurse Practitioners) who all work together to provide you with the care you need, when you need it.  We recommend signing up for the patient portal called "MyChart".  Sign up information is provided on this After Visit Summary.  MyChart is used to connect with patients for Virtual Visits (Telemedicine).  Patients are able to view lab/test results, encounter notes, upcoming appointments, etc.  Non-urgent messages can be sent to your provider as well.   To learn more about  what you can do with MyChart, go to [email or web address].

## 2020-08-14 ENCOUNTER — Telehealth: Payer: Self-pay | Admitting: Cardiovascular Disease

## 2020-08-14 NOTE — Telephone Encounter (Signed)
Patient's chart reviewed.  He had good recovery of LV function early after his heart attack.  I do not think he has had excessive risk of travel next month and it would be okay for him to do this.

## 2020-08-14 NOTE — Telephone Encounter (Signed)
Patient would like to know if it is save for him to travel overseas. He had a heart attack a month ago and was not sure if it is too early for him to travel. He would like to travel to [place] for 3 weeks in July.  Please let the patient know if it is safe for him to travel. He has not bought his ticket yet

## 2020-08-14 NOTE — Telephone Encounter (Signed)
Informed patient of Dr. Earmon Phoenix comments. He was grateful for call back.

## 2020-08-28 ENCOUNTER — Telehealth: Payer: Self-pay | Admitting: Cardiovascular Disease

## 2020-08-28 DIAGNOSIS — Z79899 Other long term (current) drug therapy: Secondary | ICD-10-CM

## 2020-08-28 DIAGNOSIS — I2102 ST elevation (STEMI) myocardial infarction involving left anterior descending coronary artery: Secondary | ICD-10-CM

## 2020-08-28 DIAGNOSIS — I10 Essential (primary) hypertension: Secondary | ICD-10-CM

## 2020-08-28 NOTE — Telephone Encounter (Signed)
Brilinta can increase the risk of bruising and bleeding, especially if he is scratching himself a lot. He does need to stay on antiplatelet therapy given recent STEMI. He can try an antihistamine to help with itching.

## 2020-08-28 NOTE — Telephone Encounter (Signed)
Spoke w patient. Adv of recommendation to use an antihistamine if needed if itchy and scratching skin.  He states its not really bad and may be scratching without thinking and then before he knows it he's bleeding.    Also states he is getting SOB- notices most when exercising-this is new for him.  No chest pain or pressure.  I adv him to have a small amount of caffeine with each dose of Brilinta to see if this helps.  He reviewed the atorvastatin and coreg, aspirin and the Brilinta.  Hopes to be able to decrease or stop these.  HR 50s up to 120s w exercise and BP is usually 110-120/.    Adv that these medicines are all part of goal directed therapy for someone whose had a cardiac event and that he should continue for now as long as he is tolerating.  He voices understanding and will discuss with Dr. Excell Seltzer at follow up with him in August.

## 2020-08-28 NOTE — Telephone Encounter (Signed)
New Message:     Pt said he had an Angioplasty in May. He have some concerns.   Pt c/o medication issue:  1. Name of Medication: Brilinta  2. How are you currently taking this medication (dosage and times per day)? 2 times a day  3. Are you having a reaction (difficulty breathing--STAT)?   4. What is your medication issue? Bneen itching, off and on, when he scratches he notice he is bleeding

## 2020-09-02 NOTE — Telephone Encounter (Signed)
Thx. If the dyspnea is bad we can switch him to clopidogrel. If tolerable will discuss at his August appt.

## 2020-09-03 LAB — HEPATIC FUNCTION PANEL
ALT: 34 IU/L (ref 0–44)
AST: 30 IU/L (ref 0–40)
Albumin: 4.5 g/dL (ref 3.8–4.8)
Alkaline Phosphatase: 81 IU/L (ref 44–121)
Bilirubin Total: 0.8 mg/dL (ref 0.0–1.2)
Bilirubin, Direct: 0.25 mg/dL (ref 0.00–0.40)
Total Protein: 6.8 g/dL (ref 6.0–8.5)

## 2020-09-03 LAB — LIPID PANEL
Chol/HDL Ratio: 2.1 ratio (ref 0.0–5.0)
Cholesterol, Total: 100 mg/dL (ref 100–199)
HDL: 47 mg/dL (ref >39)
LDL Chol Calc (NIH): 38 mg/dL (ref 0–99)
Triglycerides: 74 mg/dL (ref 0–149)
VLDL Cholesterol Cal: 15 mg/dL (ref 5–40)

## 2020-09-03 NOTE — Addendum Note (Signed)
Addended by: Theresia Majors on: 09/03/2020 02:37 PM   Modules accepted: Orders

## 2020-09-03 NOTE — Telephone Encounter (Signed)
Pt advised that the labs he has requested have been approved by Dr. Excell Seltzer and added on.

## 2020-09-03 NOTE — Telephone Encounter (Signed)
Sounds good. I'm fine to add those labs. Thanks

## 2020-09-03 NOTE — Telephone Encounter (Signed)
Spoke with [facility] and have requested additional lab work to be added on.

## 2020-09-03 NOTE — Telephone Encounter (Signed)
I spoke with the pt and he reports that he is not c/o consistent dyspnea with exertion but he is mostly concerned about his lack of stamina since his MI and that he is having dyspnea with minimal exertion on occasion and sometimes just at rest. PT denies peripheral edema.   He is also c/o dizziness with positional changes.. no syncope and it resolves after a few minutes of standing.   He says his BP ands has been 110/70-125/80 and HR in the 70's.   He thinks he could have some dehydration since he sweats a lot when outdoors and does not drink a lot of fluids.   PT had Lipid and Hepatic panel drawn today and asking to have a Lipoprotein and BMET added based on some research he has recently done on his own. He has concerns about being on a high dose statin. He will discuss further with Dr. Excell Seltzer at his 11/06/20 OV.  He declined an appt this week with an APP due to cost and he is expecting to be traveling out of the country in the next several days.   He will call his PCP to possibly be seen for follow up prior to his travels since those office visits are "cheaper" for him.   I will forward to Dr. Excell Seltzer and Edd Fabian NP (saw the pt in May and had ordered the labs drawn today) to see if we can add on the labs requested by the pt. (I talked to [facility].. blood saved for 48 hours)  (PT had elevated Creatinine during his hosp stay 07/2020) He will work on increasing his fluid intake.   Will also send to triage for further follow up after the provides have a chance to review.

## 2020-09-26 LAB — BASIC METABOLIC PANEL
BUN/Creatinine Ratio: 10 (ref 10–24)
BUN: 15 mg/dL (ref 8–27)
CO2: 20 mmol/L (ref 20–29)
Calcium: 8.9 mg/dL (ref 8.6–10.2)
Chloride: 102 mmol/L (ref 96–106)
Creatinine, Ser: 1.43 mg/dL — ABNORMAL HIGH (ref 0.76–1.27)
Glucose: 96 mg/dL (ref 65–99)
Potassium: 5.1 mmol/L (ref 3.5–5.2)
Sodium: 141 mmol/L (ref 134–144)
eGFR: 54 mL/min/{1.73_m2} — ABNORMAL LOW (ref >59)

## 2020-09-26 LAB — LIPOPROTEIN A (LPA): Lipoprotein (a): 13.4 nmol/L (ref <75.0)

## 2020-09-26 LAB — SPECIMEN STATUS REPORT

## 2020-11-06 ENCOUNTER — Ambulatory Visit: Payer: BC Managed Care – PPO | Admitting: Cardiovascular Disease

## 2020-11-06 ENCOUNTER — Encounter: Payer: Self-pay | Admitting: Cardiovascular Disease

## 2020-11-06 ENCOUNTER — Other Ambulatory Visit: Payer: Self-pay

## 2020-11-06 VITALS — BP 126/88 | HR 56 | Ht 67.0 in | Wt 187.2 lb

## 2020-11-06 DIAGNOSIS — E782 Mixed hyperlipidemia: Secondary | ICD-10-CM

## 2020-11-06 DIAGNOSIS — I2510 Atherosclerotic heart disease of native coronary artery without angina pectoris: Secondary | ICD-10-CM | POA: Diagnosis not present

## 2020-11-06 DIAGNOSIS — I10 Essential (primary) hypertension: Secondary | ICD-10-CM | POA: Diagnosis not present

## 2020-11-06 NOTE — Patient Instructions (Signed)
Medication Instructions:  Your physician recommends that you continue on your current medications as directed. Please refer to the Current Medication list given to you today.  *If you need a refill on your cardiac medications before your next appointment, please call your pharmacy*   Lab Work: None Ordered If you have labs (blood work) drawn today and your tests are completely normal, you will receive your results only by: MyChart Message (if you have MyChart) OR A paper copy in the mail If you have any lab test that is abnormal or we need to change your treatment, we will call you to review the results.   Testing/Procedures: None Ordered   Follow-Up: At [facility], you and your health needs are our priority.  As part of our continuing mission to provide you with exceptional heart care, we have created designated Provider Care Teams.  These Care Teams include your primary Cardiologist (physician) and Advanced Practice Providers (APPs -  Physician Assistants and Nurse Practitioners) who all work together to provide you with the care you need, when you need it.   Your next appointment:     May 2023  The format for your next appointment:   In Person  Provider:   You may see Tonny Bollman, MD or one of the following Advanced Practice Providers on your designated Care Team:   Tereso Newcomer, PA-C [place], [place]

## 2020-11-06 NOTE — Progress Notes (Signed)
Cardiology Office Note:    Date:  11/06/2020   ID:  Chinmay Squier, DOB [birth year], MRN [id number]  PCP:  Catha Gosselin, MD   [facility] Providers Cardiologist:  Tonny Bollman, MD     Referring MD: Catha Gosselin, MD   Chief Complaint  Patient presents with   Coronary Artery Disease     History of Present Illness:    Randall Jimenez is a 65 y.o. male with a hx of coronary artery disease, presenting for follow-up evaluation.  The patient presented with an anterior STEMI in May 2022.  This was complicated by ventricular fibrillation on presentation.  He underwent primary PCI of the LAD and had an otherwise uncomplicated hospital stay.  The patient is here alone today. He is doing well. He reports low BP based on his home readings. He has had some dizzy spells with postural change.  This is improved since he is liberalize salt in his diet a bit.  He has had no chest pain or shortness of breath.  He exercises on a very regular basis with no exertional symptoms.  He reports easy bruising and bleeding since he has been on Brilinta.  No other acute complaints.  He does have concerns about his medications and does not like taking prescription medicines in general.  Past Medical History:  Diagnosis Date   Hyperlipidemia    Hypertension     Past Surgical History:  Procedure Laterality Date   CORONARY/GRAFT ACUTE MI REVASCULARIZATION N/A 07/13/2020   Procedure: Coronary/Graft Acute MI Revascularization;  Surgeon: Tonny Bollman, MD;  Location: [facility] INVASIVE CV LAB;  Service: Cardiovascular;  Laterality: N/A;   LEFT HEART CATH AND CORONARY ANGIOGRAPHY N/A 07/13/2020   Procedure: LEFT HEART CATH AND CORONARY ANGIOGRAPHY;  Surgeon: Tonny Bollman, MD;  Location: [facility] INVASIVE CV LAB;  Service: Cardiovascular;  Laterality: N/A;    Current Medications: Current Meds  Medication Sig   aspirin 81 MG EC tablet Take 1 tablet (81 mg total) by mouth daily. Swallow whole.    atorvastatin (LIPITOR) 80 MG tablet Take 1 tablet (80 mg total) by mouth daily at 8 pm.   carvedilol (COREG) 3.125 MG tablet Take 1 tablet (3.125 mg total) by mouth 2 (two) times daily with a meal.   lisinopril (ZESTRIL) 5 MG tablet Take 5 mg by mouth daily.   nitroGLYCERIN (NITROSTAT) 0.4 MG SL tablet Place 1 tablet (0.4 mg total) under the tongue every 5 (five) minutes x 3 doses as needed for chest pain.   ticagrelor (BRILINTA) 90 MG TABS tablet Take 1 tablet (90 mg total) by mouth 2 (two) times daily.     Allergies:   Penicillins   Social History   Socioeconomic History   Marital status: Married    Spouse name: Not on file   Number of children: Not on file   Years of education: Not on file   Highest education level: Not on file  Occupational History   Not on file  Tobacco Use   Smoking status: Never   Smokeless tobacco: Never  Substance and Sexual Activity   Alcohol use: Not on file   Drug use: Never   Sexual activity: Not on file  Other Topics Concern   Not on file  Social History Narrative   Not on file   Social Determinants of Health   Financial Resource Strain: Not on file  Food Insecurity: Not on file  Transportation Needs: Not on file  Physical Activity: Not on file  Stress: Not on file  Social Connections: Not on file     Family History: The patient's family history is negative for CAD.  ROS:   Please see the history of present illness.    All other systems reviewed and are negative.  EKGs/Labs/Other Studies Reviewed:    The following studies were reviewed today: Echo 07/14/20: 1. Left ventricular ejection fraction, by estimation, is 55 to 60%. Left  ventricular ejection fraction by 3D volume is 57 %. The left ventricle has  normal function. The left ventricle has no regional wall motion  abnormalities. Left ventricular diastolic   parameters were normal.   2. Right ventricular systolic function is normal. The right ventricular  size is normal.   3.  The mitral valve is normal in structure. Trivial mitral valve  regurgitation. No evidence of mitral stenosis.   4. The aortic valve is normal in structure. Aortic valve regurgitation is  not visualized. No aortic stenosis is present.   5. The inferior vena cava is normal in size with greater than 50%  respiratory variability, suggesting right atrial pressure of 3 mmHg.   Cardiac Catheterization 07/13/2020: Conclusion    Prox LAD lesion is 99% stenosed. A drug-eluting stent was successfully placed using a SYNERGY XD 3.50X20. Post intervention, there is a 0% residual stenosis. There is mild left ventricular systolic dysfunction. LV end diastolic pressure is normal. The left ventricular ejection fraction is 50-55% by visual estimate. There is no mitral valve regurgitation.   1.  Acute anterior STEMI secondary to thrombotic subtotal occlusion of the proximal LAD with TIMI I flow, treated successfully with primary PCI using a 3.5 x 20 mm Synergy DES 2.  Patent left circumflex (dominant vessel) with no obstructive disease 3.  Patent nondominant RCA with no obstructive disease 4.  Mild hypokinesis of the distal anterolateral and apical walls, LVEF estimated at 50 to 55% 5.  Ventricular fibrillation x3 in the Cath Lab prior to PCI, defibrillated x3, treated also with lidocaine and amiodarone.   Recommendations: Continue Aggrastat x2 hours, continue amiodarone until tomorrow morning, initiate post MI medical therapy as tolerated.  Recommend ICU care until tomorrow and as long as hemodynamically stable he can move to a telemetry bed and would be eligible for hospital discharge Monday morning.  Check 2D echo.  Continue aspirin and ticagrelor x12 months without interruption.  EKG:  EKG is not ordered today.    Recent Labs: 07/14/2020: Hemoglobin 13.5; Platelets 178 09/03/2020: ALT 34; BUN 15; Creatinine, Ser 1.43; Potassium 5.1; Sodium 141  Recent Lipid Panel    Component Value Date/Time   CHOL 100  09/03/2020 0807   TRIG 74 09/03/2020 0807   HDL 47 09/03/2020 0807   CHOLHDL 2.1 09/03/2020 0807   CHOLHDL 2.8 07/13/2020 1202   VLDL 18 07/13/2020 1202   LDLCALC 38 09/03/2020 0807     Risk Assessment/Calculations:        Physical Exam:    VS:  BP 126/88   Pulse (!) 56   Ht 5\' 7"  (1.702 m)   Wt 187 lb 3.2 oz (84.9 kg)   SpO2 97%   BMI 29.32 kg/m     Wt Readings from Last 3 Encounters:  11/06/20 187 lb 3.2 oz (84.9 kg)  07/23/20 189 lb 9.6 oz (86 kg)  07/13/20 192 lb (87.1 kg)     GEN:  Well nourished, well developed in no acute distress HEENT: Normal NECK: No JVD; No carotid bruits LYMPHATICS: No lymphadenopathy CARDIAC: RRR, no murmurs, rubs, gallops RESPIRATORY:  Clear  to auscultation without rales, wheezing or rhonchi  ABDOMEN: Soft, non-tender, non-distended MUSCULOSKELETAL:  No edema; No deformity  SKIN: Warm and dry NEUROLOGIC:  Alert and oriented x 3 PSYCHIATRIC:  Normal affect   ASSESSMENT:    1. Coronary artery disease involving native coronary artery of native heart without angina pectoris   2. Mixed hyperlipidemia   3. Essential hypertension    PLAN:    In order of problems listed above:  1.  He will continue on his current medical program.  I will see him back in 9 months when he is 1 year out from his heart attack.  At that time I would anticipate stopping ticagrelor. 2.  We discussed a high intensity statin drug today.  He does not like taking atorvastatin at this dose.  He understands the rationale for it now.  We discussed secondary versus primary prevention.  He is willing to continue this.  Will address again when he returns for follow-up and may reduce his dose to 40 mg daily at that time. 3.  Blood pressure controlled.  Continue lisinopril and carvedilol at current doses.    Medication Adjustments/Labs and Tests Ordered: Current medicines are reviewed at length with the patient today.  Concerns regarding medicines are outlined above.  No  orders of the defined types were placed in this encounter.  No orders of the defined types were placed in this encounter.   Patient Instructions  Medication Instructions:  Your physician recommends that you continue on your current medications as directed. Please refer to the Current Medication list given to you today.  *If you need a refill on your cardiac medications before your next appointment, please call your pharmacy*   Lab Work: None Ordered If you have labs (blood work) drawn today and your tests are completely normal, you will receive your results only by: MyChart Message (if you have MyChart) OR A paper copy in the mail If you have any lab test that is abnormal or we need to change your treatment, we will call you to review the results.   Testing/Procedures: None Ordered   Follow-Up: At [facility], you and your health needs are our priority.  As part of our continuing mission to provide you with exceptional heart care, we have created designated Provider Care Teams.  These Care Teams include your primary Cardiologist (physician) and Advanced Practice Providers (APPs -  Physician Assistants and Nurse Practitioners) who all work together to provide you with the care you need, when you need it.   Your next appointment:     May 2023  The format for your next appointment:   In Person  Provider:   You may see Tonny Bollman, MD or one of the following Advanced Practice Providers on your designated Care Team:   Tereso Newcomer, PA-C [place], [place]      Signed, Tonny Bollman, MD  11/06/2020 4:20 PM    [facility]

## 2021-04-29 ENCOUNTER — Encounter: Payer: Self-pay | Admitting: *Deleted

## 2021-07-04 ENCOUNTER — Ambulatory Visit: Payer: BC Managed Care – PPO | Admitting: Physician Assistant

## 2021-07-10 NOTE — Progress Notes (Signed)
?Cardiology Office Note:   ? ?Date:  07/11/2021  ? ?IDKatheran Jimenez:  Randall Jimenez, DOB 08/14/1955, MRN [id number] ? ?PCP:  Catha GosselinLittle, Kevin, MD  ?[facility] ?Cardiologist:  Randall BollmanMichael Cooper, MD     ?Referring MD: Catha GosselinLittle, Kevin, MD  ? ?Chief Complaint:  Follow-up for CAD ?  ? ?Patient Profile: ?Coronary artery disease  ?Ant STEMI in 5/22 s/p DES to LAD ?C/b VF arrest ?Hypertension  ?Hyperlipidemia  ? ?Prior CV Studies: ?Coronary/Graft Acute MI Revascularization 07/13/2020 ?? Prox LAD lesion is 99% stenosed. ?? PCI: 3.5 x 20 mm Synergy DES to the proximal LAD  ?? The left ventricular ejection fraction is 50-55% by visual estimate. ? ?1.  Acute anterior STEMI secondary to thrombotic subtotal occlusion of the proximal LAD with TIMI I flow, treated successfully with primary PCI using a 3.5 x 20 mm Synergy DES ?2.  Patent left circumflex (dominant vessel) with no obstructive disease ?3.  Patent nondominant RCA with no obstructive disease ?4.  Mild hypokinesis of the distal anterolateral and apical walls, LVEF estimated at 50 to 55% ? ?  ?ECHO COMPLETE WO IMAGING ENHANCING AGENT 07/14/2020 ?EF 55-60, no RWMA, normal RVSF, trivial MR ?   ? ?History of Present Illness:   ?Randall Jamesourollah Pheasant is a 66 y.o. male with the above problem list.  He was last seen by Dr. Excell Seltzerooper in Aug 2022.  He returns for f/u.  He is here alone.  He is doing well without chest discomfort.  He exercises on a regular basis.  He does note occasional episodes of shortness of breath.  This does not limit his activity.  He has experienced shortness of breath since being on Brilinta.  He is still interested in decreasing his dose of Lipitor.  He has not had orthopnea, leg edema, syncope     ?   ?Past Medical History:  ?Diagnosis Date  ? Coronary artery disease involving native coronary artery of native heart without angina pectoris 07/13/2020  ? S/p anterior STEMI in May 2022 treated with a DES to the LAD Echo May 2022: EF 55-60  ? Hyperlipidemia   ?  Hypertension   ? ?Current Medications: ?Current Meds  ?Medication Sig  ? aspirin 81 MG EC tablet Take 1 tablet (81 mg total) by mouth daily. Swallow whole.  ? carvedilol (COREG) 3.125 MG tablet Take 1 tablet (3.125 mg total) by mouth 2 (two) times daily with a meal.  ? lisinopril (ZESTRIL) 5 MG tablet Take 5 mg by mouth daily.  ? nitroGLYCERIN (NITROSTAT) 0.4 MG SL tablet Place 1 tablet (0.4 mg total) under the tongue every 5 (five) minutes x 3 doses as needed for chest pain.  ? ticagrelor (BRILINTA) 90 MG TABS tablet Take 1 tablet (90 mg total) by mouth 2 (two) times daily.  ? [DISCONTINUED] atorvastatin (LIPITOR) 80 MG tablet Take 1 tablet (80 mg total) by mouth daily at 8 pm.  ?  ?Allergies:   Penicillins  ? ?Social History  ? ?Tobacco Use  ? Smoking status: Never  ? Smokeless tobacco: Never  ?Substance Use Topics  ? Drug use: Never  ?  ?Family Hx: ?The patient's family history is negative for CAD. ? ?Review of Systems  ?Endocrine: Positive for cold intolerance.   ? ?EKGs/Labs/Other Test Reviewed:   ? ?EKG:  EKG is  ordered today.  The ekg ordered today demonstrates sinus bradycardia, HR 50, normal axis, no ST-T wave changes, QTc 432, similar to prior tracing ? ?Recent Labs: ?07/14/2020: Hemoglobin 13.5; Platelets 178 ?  09/03/2020: ALT 34; BUN 15; Creatinine, Ser 1.43; Potassium 5.1; Sodium 141  ? ?Recent Lipid Panel ?Recent Labs  ?  07/13/20 ?1202 09/03/20 ?0807  ?CHOL 212* 100  ?TRIG 92 74  ?HDL 75 47  ?VLDL 18  --   ?LDLCALC 119* 38  ?  ?Labs obtained through [facility] - personally reviewed and interpreted: ?04/04/2021: Total cholesterol 109, HDL 56, LDL 39, triglycerides 64, Hgb 14.5, creatinine 1.25, K+ 4.6, ALT 25 ? ?Risk Assessment/Calculations:   ?  ?    ?Physical Exam:   ? ?VS:  BP (!) 143/82   Pulse (!) 50   Ht 5\' 7"  (1.702 m)   Wt 190 lb 9.6 oz (86.5 kg)   SpO2 98%   BMI 29.85 kg/m?    ? ?Wt Readings from Last 3 Encounters:  ?07/11/21 190 lb 9.6 oz (86.5 kg)  ?11/06/20 187 lb 3.2 oz (84.9 kg)  ?07/23/20  189 lb 9.6 oz (86 kg)  ?  ?Constitutional:   ?   Appearance: Healthy appearance. Not in distress.  ?Neck:  ?   Vascular: No JVR. JVD normal.  ?Pulmonary:  ?   Effort: Pulmonary effort is normal.  ?   Breath sounds: No wheezing. No rales.  ?Cardiovascular:  ?   Normal rate. Regular rhythm. Normal S1. Normal S2.   ?   Murmurs: There is no murmur.  ?Edema: ?   Peripheral edema absent.  ?Abdominal:  ?   Palpations: Abdomen is soft.  ?Skin: ?   General: Skin is warm and dry.  ?Neurological:  ?   General: No focal deficit present.  ?   Mental Status: Alert and oriented to person, place and time.  ?   Cranial Nerves: Cranial nerves are intact.  ?  ?    ?ASSESSMENT & PLAN:   ?Coronary artery disease involving native coronary artery of native heart without angina pectoris ?Status post anterior STEMI in May 2022 treated with DES to the LAD.  EF remains preserved.  He is doing well without chest discomfort to suggest angina.  He has occasional shortness of breath with certain activities that seems to be related to side effects to ticagrelor.  He has completed 1 year of dual antiplatelet therapy since his ACS. ?Finish ticagrelor and discontinue ?Continue aspirin 81 mg daily, carvedilol 3.125 mg twice daily, lisinopril 5 mg daily ?Continue statin therapy ?Follow-up 6 months ?Return sooner if shortness of breath worsens or changes ? ?Essential hypertension ?Blood pressure above target.  He notes optimal blood pressures at home.  He has not taken carvedilol yet today.  Continue carvedilol 3.25 mg twice daily, lisinopril 5 mg daily.  Monitor blood pressure at home and send readings for review via MyChart. ? ?Hyperlipidemia LDL goal <70 ?LDL optimal.  He is concerned about taking such a high dose of statin therapy.  We discussed risk reduction with high intensity statin therapy and LDL <70.  Decrease atorvastatin to 40 mg daily.  He has follow-up labs with primary care in 2 months.  If LDL increases above 70, we will need to  increase his dose of atorvastatin back to 80 mg versus considering changing to rosuvastatin ?  ?     ?   ?Dispo:  Return in about 6 months (around 01/11/2022) for Routine Follow Up, w/ Dr. 13/07/2021, or Excell Seltzer, PA-C.  ? ?Medication Adjustments/Labs and Tests Ordered: ?Current medicines are reviewed at length with the patient today.  Concerns regarding medicines are outlined above.  ?Tests Ordered: ?Orders  Placed This Encounter  ?Procedures  ? EKG 12-Lead  ? ?Medication Changes: ?Meds ordered this encounter  ?Medications  ? atorvastatin (LIPITOR) 40 MG tablet  ?  Sig: Take 1 tablet (40 mg total) by mouth daily.  ?  Dispense:  90 tablet  ?  Refill:  3  ? ?Signed, ?Tereso Newcomer, PA-C  ?07/11/2021 8:52 AM    ?[facility] ?[address] [place], [place], [place]  [address] ?Phone: [phone]; Fax: [phone]  ?

## 2021-07-11 ENCOUNTER — Ambulatory Visit: Payer: BC Managed Care – PPO | Admitting: Physician Assistant

## 2021-07-11 ENCOUNTER — Encounter: Payer: Self-pay | Admitting: Physician Assistant

## 2021-07-11 VITALS — BP 143/82 | HR 50 | Ht 67.0 in | Wt 190.6 lb

## 2021-07-11 DIAGNOSIS — I10 Essential (primary) hypertension: Secondary | ICD-10-CM

## 2021-07-11 DIAGNOSIS — E785 Hyperlipidemia, unspecified: Secondary | ICD-10-CM | POA: Diagnosis not present

## 2021-07-11 DIAGNOSIS — I2510 Atherosclerotic heart disease of native coronary artery without angina pectoris: Secondary | ICD-10-CM | POA: Diagnosis not present

## 2021-07-11 MED ORDER — ATORVASTATIN CALCIUM 40 MG PO TABS: 40.0000 mg | ORAL_TABLET | Freq: Every day | ORAL | 3 refills | Status: DC

## 2021-07-11 NOTE — Patient Instructions (Signed)
Medication Instructions:  ?Your physician has recommended you make the following change in your medication:  ?1.Stop brilinta once you finish the supply that you have on hand ?2.Decrease lipitor to 40 mg daily ?*If you need a refill on your cardiac medications before your next appointment, please call your pharmacy* ? ? ?Lab Work: ?Have your primary care provider fax [place] a copy of your lipids in 2-3 months to [phone] ?If you have labs (blood work) drawn today and your tests are completely normal, you will receive your results only by: ?MyChart Message (if you have MyChart) OR ?A paper copy in the mail ?If you have any lab test that is abnormal or we need to change your treatment, we will call you to review the results. ? ?Follow-Up: ?At [facility], you and your health needs are our priority.  As part of our continuing mission to provide you with exceptional heart care, we have created designated Provider Care Teams.  These Care Teams include your primary Cardiologist (physician) and Advanced Practice Providers (APPs -  Physician Assistants and Nurse Practitioners) who all work together to provide you with the care you need, when you need it. ? ? ?Your next appointment:   ?6 month(s) ? ?The format for your next appointment:   ?In Person ? ?Provider:   ?Tonny Bollman, MD  or Tereso Newcomer, PA-C       ? ? ?Other Instructions ?1.Call if your shortness of breath gets worse or changes ?2.Check your blood pressure over the next week and send [place] your readings through MyChart ? ?Important Information About Sugar ? ? ? ? ?  ?

## 2021-07-11 NOTE — Assessment & Plan Note (Signed)
Blood pressure above target.  He notes optimal blood pressures at home.  He has not taken carvedilol yet today.  Continue carvedilol 3.25 mg twice daily, lisinopril 5 mg daily.  Monitor blood pressure at home and send readings for review via MyChart. ?

## 2021-07-11 NOTE — Assessment & Plan Note (Signed)
Status post anterior STEMI in May 2022 treated with DES to the LAD.  EF remains preserved.  He is doing well without chest discomfort to suggest angina.  He has occasional shortness of breath with certain activities that seems to be related to side effects to ticagrelor.  He has completed 1 year of dual antiplatelet therapy since his ACS. ?? Finish ticagrelor and discontinue ?? Continue aspirin 81 mg daily, carvedilol 3.125 mg twice daily, lisinopril 5 mg daily ?? Continue statin therapy ?? Follow-up 6 months ?? Return sooner if shortness of breath worsens or changes ?

## 2021-07-11 NOTE — Assessment & Plan Note (Signed)
LDL optimal.  He is concerned about taking such a high dose of statin therapy.  We discussed risk reduction with high intensity statin therapy and LDL <70.  Decrease atorvastatin to 40 mg daily.  He has follow-up labs with primary care in 2 months.  If LDL increases above 70, we will need to increase his dose of atorvastatin back to 80 mg versus considering changing to rosuvastatin ?

## 2021-12-15 DIAGNOSIS — G5761 Lesion of plantar nerve, right lower limb: Secondary | ICD-10-CM | POA: Diagnosis not present

## 2021-12-15 DIAGNOSIS — M2041 Other hammer toe(s) (acquired), right foot: Secondary | ICD-10-CM | POA: Diagnosis not present

## 2021-12-15 DIAGNOSIS — M7741 Metatarsalgia, right foot: Secondary | ICD-10-CM | POA: Diagnosis not present

## 2022-01-06 DIAGNOSIS — R1031 Right lower quadrant pain: Secondary | ICD-10-CM | POA: Diagnosis not present

## 2022-01-06 DIAGNOSIS — M25562 Pain in left knee: Secondary | ICD-10-CM | POA: Diagnosis not present

## 2022-01-06 DIAGNOSIS — M79674 Pain in right toe(s): Secondary | ICD-10-CM | POA: Diagnosis not present

## 2022-01-15 DIAGNOSIS — M2041 Other hammer toe(s) (acquired), right foot: Secondary | ICD-10-CM | POA: Diagnosis not present

## 2022-01-15 DIAGNOSIS — M7741 Metatarsalgia, right foot: Secondary | ICD-10-CM | POA: Diagnosis not present

## 2022-01-15 DIAGNOSIS — G5761 Lesion of plantar nerve, right lower limb: Secondary | ICD-10-CM | POA: Diagnosis not present

## 2022-01-18 NOTE — Progress Notes (Unsigned)
Cardiology Office Note:    Date:  01/19/2022   ID:  Randall Jimenez, DOB 21-Feb-1956, MRN [id number]  PCP:  Catha Gosselin, MD   [facility] Providers Cardiologist:  Tonny Bollman, MD     Referring MD: Catha Gosselin, MD   Chief Complaint  Patient presents with   Coronary Artery Disease    History of Present Illness:    Randall Jimenez is a 66 y.o. male with a hx of: Coronary artery disease  Ant STEMI in 5/22 s/p DES to LAD C/b VF arrest Hypertension  Hyperlipidemia    The patient is here alone today.  He is doing very well.  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  He has retired in the past year and he is really enjoying his retirement.  He stays active with yard work, walking, and regular exercise. Today, he denies symptoms of palpitations, chest pain, shortness of breath, orthopnea, PND, lower extremity edema, dizziness, or syncope.  Past Medical History:  Diagnosis Date   Coronary artery disease 07/13/2020   S/p anterior STEMI in May 2022 treated with a DES to the LAD Echo May 2022: EF 55-60   Hyperlipidemia    Hypertension     Past Surgical History:  Procedure Laterality Date   CORONARY/GRAFT ACUTE MI REVASCULARIZATION N/A 07/13/2020   Procedure: Coronary/Graft Acute MI Revascularization;  Surgeon: Tonny Bollman, MD;  Location: [facility] INVASIVE CV LAB;  Service: Cardiovascular;  Laterality: N/A;   LEFT HEART CATH AND CORONARY ANGIOGRAPHY N/A 07/13/2020   Procedure: LEFT HEART CATH AND CORONARY ANGIOGRAPHY;  Surgeon: Tonny Bollman, MD;  Location: [facility] INVASIVE CV LAB;  Service: Cardiovascular;  Laterality: N/A;    Current Medications: Current Meds  Medication Sig   aspirin 81 MG EC tablet Take 1 tablet (81 mg total) by mouth daily. Swallow whole.   atorvastatin (LIPITOR) 40 MG tablet Take 1 tablet (40 mg total) by mouth daily.   lisinopril (ZESTRIL) 5 MG tablet Take 5 mg by mouth daily.    sildenafil (REVATIO) 20 MG tablet Take 2-5 tablets by mouth as needed     Allergies:   Penicillins   Social History   Socioeconomic History   Marital status: Married    Spouse name: Not on file   Number of children: Not on file   Years of education: Not on file   Highest education level: Not on file  Occupational History   Not on file  Tobacco Use   Smoking status: Never   Smokeless tobacco: Never  Substance and Sexual Activity   Alcohol use: Not on file   Drug use: Never   Sexual activity: Not on file  Other Topics Concern   Not on file  Social History Narrative   Not on file   Social Determinants of Health   Financial Resource Strain: Not on file  Food Insecurity: Not on file  Transportation Needs: Not on file  Physical Activity: Not on file  Stress: Not on file  Social Connections: Not on file     Family History: The patient's family history is negative for CAD.  ROS:   Please see the history of present illness.    All other systems reviewed and are negative.  EKGs/Labs/Other Studies Reviewed:    The following studies were reviewed today: Cardiac Cath 07/13/2020: Prox LAD lesion is 99% stenosed. A drug-eluting stent was successfully placed using a SYNERGY XD 3.50X20. Post intervention, there is a 0% residual  stenosis. There is mild left ventricular systolic dysfunction. LV end diastolic pressure is normal. The left ventricular ejection fraction is 50-55% by visual estimate. There is no mitral valve regurgitation.   1.  Acute anterior STEMI secondary to thrombotic subtotal occlusion of the proximal LAD with TIMI I flow, treated successfully with primary PCI using a 3.5 x 20 mm Synergy DES 2.  Patent left circumflex (dominant vessel) with no obstructive disease 3.  Patent nondominant RCA with no obstructive disease 4.  Mild hypokinesis of the distal anterolateral and apical walls, LVEF estimated at 50 to 55% 5.  Ventricular fibrillation x3 in the Cath Lab  prior to PCI, defibrillated x3, treated also with lidocaine and amiodarone.   Recommendations: Continue Aggrastat x2 hours, continue amiodarone until tomorrow morning, initiate post MI medical therapy as tolerated.  Recommend ICU care until tomorrow and as long as hemodynamically stable he can move to a telemetry bed and would be eligible for hospital discharge Monday morning.  Check 2D echo.  Continue aspirin and ticagrelor x12 months without interruption.  Echo 07/14/2020: 1. Left ventricular ejection fraction, by estimation, is 55 to 60%. Left  ventricular ejection fraction by 3D volume is 57 %. The left ventricle has  normal function. The left ventricle has no regional wall motion  abnormalities. Left ventricular diastolic   parameters were normal.   2. Right ventricular systolic function is normal. The right ventricular  size is normal.   3. The mitral valve is normal in structure. Trivial mitral valve  regurgitation. No evidence of mitral stenosis.   4. The aortic valve is normal in structure. Aortic valve regurgitation is  not visualized. No aortic stenosis is present.   5. The inferior vena cava is normal in size with greater than 50%  respiratory variability, suggesting right atrial pressure of 3 mmHg.   EKG:  EKG is not ordered today.    Recent Labs: No results found for requested labs within last 365 days.  Recent Lipid Panel    Component Value Date/Time   CHOL 100 09/03/2020 0807   TRIG 74 09/03/2020 0807   HDL 47 09/03/2020 0807   CHOLHDL 2.1 09/03/2020 0807   CHOLHDL 2.8 07/13/2020 1202   VLDL 18 07/13/2020 1202   LDLCALC 38 09/03/2020 0807     Risk Assessment/Calculations:                Physical Exam:    VS:  BP 128/74   Pulse 72   Ht 5\' 7"  (1.702 m)   Wt 191 lb 6.4 oz (86.8 kg)   SpO2 97%   BMI 29.98 kg/m     Wt Readings from Last 3 Encounters:  01/19/22 191 lb 6.4 oz (86.8 kg)  07/11/21 190 lb 9.6 oz (86.5 kg)  11/06/20 187 lb 3.2 oz (84.9 kg)      GEN:  Well nourished, well developed in no acute distress HEENT: Normal NECK: No JVD; No carotid bruits LYMPHATICS: No lymphadenopathy CARDIAC: RRR, no murmurs, rubs, gallops RESPIRATORY:  Clear to auscultation without rales, wheezing or rhonchi  ABDOMEN: Soft, non-tender, non-distended MUSCULOSKELETAL:  No edema; No deformity  SKIN: Warm and dry NEUROLOGIC:  Alert and oriented x 3 PSYCHIATRIC:  Normal affect   ASSESSMENT:    1. Coronary artery disease involving native coronary artery of native heart without angina pectoris   2. Essential hypertension   3. Hyperlipidemia LDL goal <70   4. Erectile dysfunction, unspecified erectile dysfunction type    PLAN:  In order of problems listed above:  The patient is stable without any symptoms of angina.  He continues on aspirin, high intensity statin drug with atorvastatin 40 mg daily, and lisinopril.  No changes are made.  He is encouraged to continue with his exercise regimen.  He will follow-up in 1 year. Blood pressure is well controlled on lisinopril 5 mg daily.  Most recent labs reviewed with a creatinine of 1.31 and potassium of 4.4. Lipids are excellent on atorvastatin 40 mg daily.  Total cholesterol 121, HDL 60, LDL 47. Prescription written for sildenafil.  Discussed precautions around use of nitroglycerin.  Patient understands.     Medication Adjustments/Labs and Tests Ordered: Current medicines are reviewed at length with the patient today.  Concerns regarding medicines are outlined above.  No orders of the defined types were placed in this encounter.  Meds ordered this encounter  Medications   sildenafil (REVATIO) 20 MG tablet    Sig: Take 2-5 tablets by mouth as needed    Dispense:  90 tablet    Refill:  3    Patient Instructions  Medication Instructions:  TAKE Sildenafil 20mg  2-5 tablets as needed *If you need a refill on your cardiac medications before your next appointment, please call your  pharmacy*   Lab Work: NONE If you have labs (blood work) drawn today and your tests are completely normal, you will receive your results only by: MyChart Message (if you have MyChart) OR A paper copy in the mail If you have any lab test that is abnormal or we need to change your treatment, we will call you to review the results.   Testing/Procedures: NONE   Follow-Up: At [facility], you and your health needs are our priority.  As part of our continuing mission to provide you with exceptional heart care, we have created designated Provider Care Teams.  These Care Teams include your primary Cardiologist (physician) and Advanced Practice Providers (APPs -  Physician Assistants and Nurse Practitioners) who all work together to provide you with the care you need, when you need it.  We recommend signing up for the patient portal called "MyChart".  Sign up information is provided on this After Visit Summary.  MyChart is used to connect with patients for Virtual Visits (Telemedicine).  Patients are able to view lab/test results, encounter notes, upcoming appointments, etc.  Non-urgent messages can be sent to your provider as well.   To learn more about what you can do with MyChart, go to [facility].    Your next appointment:   1 year(s)  The format for your next appointment:   In Person  Provider:   [email or web address], MD       Important Information About Sugar         Signed, Tonny Bollman, MD  01/19/2022 2:16 PM    [facility]

## 2022-01-19 ENCOUNTER — Encounter: Payer: Self-pay | Admitting: Cardiovascular Disease

## 2022-01-19 ENCOUNTER — Ambulatory Visit: Payer: Medicare PPO | Attending: Cardiovascular Disease | Admitting: Cardiovascular Disease

## 2022-01-19 VITALS — BP 128/74 | HR 72 | Ht 67.0 in | Wt 191.4 lb

## 2022-01-19 DIAGNOSIS — I10 Essential (primary) hypertension: Secondary | ICD-10-CM

## 2022-01-19 DIAGNOSIS — N529 Male erectile dysfunction, unspecified: Secondary | ICD-10-CM | POA: Diagnosis not present

## 2022-01-19 DIAGNOSIS — I2510 Atherosclerotic heart disease of native coronary artery without angina pectoris: Secondary | ICD-10-CM

## 2022-01-19 DIAGNOSIS — E785 Hyperlipidemia, unspecified: Secondary | ICD-10-CM

## 2022-01-19 MED ORDER — SILDENAFIL CITRATE 20 MG PO TABS: ORAL_TABLET | ORAL | 3 refills | Status: DC

## 2022-01-19 NOTE — Patient Instructions (Signed)
Medication Instructions:  TAKE Sildenafil 20mg  2-5 tablets as needed *If you need a refill on your cardiac medications before your next appointment, please call your pharmacy*   Lab Work: NONE If you have labs (blood work) drawn today and your tests are completely normal, you will receive your results only by: MyChart Message (if you have MyChart) OR A paper copy in the mail If you have any lab test that is abnormal or we need to change your treatment, we will call you to review the results.   Testing/Procedures: NONE   Follow-Up: At [facility], you and your health needs are our priority.  As part of our continuing mission to provide you with exceptional heart care, we have created designated Provider Care Teams.  These Care Teams include your primary Cardiologist (physician) and Advanced Practice Providers (APPs -  Physician Assistants and Nurse Practitioners) who all work together to provide you with the care you need, when you need it.  We recommend signing up for the patient portal called "MyChart".  Sign up information is provided on this After Visit Summary.  MyChart is used to connect with patients for Virtual Visits (Telemedicine).  Patients are able to view lab/test results, encounter notes, upcoming appointments, etc.  Non-urgent messages can be sent to your provider as well.   To learn more about what you can do with MyChart, go to [facility].    Your next appointment:   1 year(s)  The format for your next appointment:   In Person  Provider:   [email or web address], MD       Important Information About Sugar

## 2022-01-21 ENCOUNTER — Telehealth: Payer: Self-pay | Admitting: Cardiovascular Disease

## 2022-01-21 MED ORDER — SILDENAFIL CITRATE 20 MG PO TABS: ORAL_TABLET | ORAL | 3 refills | Status: DC

## 2022-01-21 NOTE — Telephone Encounter (Signed)
Returned call to patient.  Confirmed pharmacy, sent sildenafil Rx to [facility] as requested.  Patient states cost of medication was too high at [facility] [place].  Patient expressed appreciation for assistance.

## 2022-01-21 NOTE — Telephone Encounter (Signed)
  Pt c/o medication issue:  1. Name of Medication: sildenafil (REVATIO) 20 MG tablet   2. How are you currently taking this medication (dosage and times per day)?   3. Are you having a reaction (difficulty breathing--STAT)?   4. What is your medication issue? Pt would [facility]   [facility]  [address] [place], [place] [address]    [phone]

## 2022-05-11 DIAGNOSIS — L309 Dermatitis, unspecified: Secondary | ICD-10-CM | POA: Diagnosis not present

## 2022-05-18 DIAGNOSIS — Z125 Encounter for screening for malignant neoplasm of prostate: Secondary | ICD-10-CM | POA: Diagnosis not present

## 2022-05-18 DIAGNOSIS — R7309 Other abnormal glucose: Secondary | ICD-10-CM | POA: Diagnosis not present

## 2022-05-18 DIAGNOSIS — I2510 Atherosclerotic heart disease of native coronary artery without angina pectoris: Secondary | ICD-10-CM | POA: Diagnosis not present

## 2022-05-18 DIAGNOSIS — I10 Essential (primary) hypertension: Secondary | ICD-10-CM | POA: Diagnosis not present

## 2022-05-18 DIAGNOSIS — E781 Pure hyperglyceridemia: Secondary | ICD-10-CM | POA: Diagnosis not present

## 2022-05-18 DIAGNOSIS — N1831 Chronic kidney disease, stage 3a: Secondary | ICD-10-CM | POA: Diagnosis not present

## 2022-05-18 DIAGNOSIS — M79674 Pain in right toe(s): Secondary | ICD-10-CM | POA: Diagnosis not present

## 2022-05-18 DIAGNOSIS — Z0000 Encounter for general adult medical examination without abnormal findings: Secondary | ICD-10-CM | POA: Diagnosis not present

## 2022-06-01 DIAGNOSIS — D225 Melanocytic nevi of trunk: Secondary | ICD-10-CM | POA: Diagnosis not present

## 2022-06-01 DIAGNOSIS — L814 Other melanin hyperpigmentation: Secondary | ICD-10-CM | POA: Diagnosis not present

## 2022-06-01 DIAGNOSIS — L821 Other seborrheic keratosis: Secondary | ICD-10-CM | POA: Diagnosis not present

## 2022-06-12 DIAGNOSIS — M7741 Metatarsalgia, right foot: Secondary | ICD-10-CM | POA: Diagnosis not present

## 2022-06-12 DIAGNOSIS — M79674 Pain in right toe(s): Secondary | ICD-10-CM | POA: Diagnosis not present

## 2022-06-26 DIAGNOSIS — M79673 Pain in unspecified foot: Secondary | ICD-10-CM | POA: Diagnosis not present

## 2022-06-26 DIAGNOSIS — R198 Other specified symptoms and signs involving the digestive system and abdomen: Secondary | ICD-10-CM | POA: Diagnosis not present

## 2022-06-29 DIAGNOSIS — R198 Other specified symptoms and signs involving the digestive system and abdomen: Secondary | ICD-10-CM | POA: Diagnosis not present

## 2022-07-03 DIAGNOSIS — M79671 Pain in right foot: Secondary | ICD-10-CM | POA: Diagnosis not present

## 2022-07-17 DIAGNOSIS — M7741 Metatarsalgia, right foot: Secondary | ICD-10-CM | POA: Diagnosis not present

## 2022-08-06 DIAGNOSIS — H5203 Hypermetropia, bilateral: Secondary | ICD-10-CM | POA: Diagnosis not present

## 2022-08-06 DIAGNOSIS — H2513 Age-related nuclear cataract, bilateral: Secondary | ICD-10-CM | POA: Diagnosis not present

## 2022-08-06 DIAGNOSIS — H35371 Puckering of macula, right eye: Secondary | ICD-10-CM | POA: Diagnosis not present

## 2022-08-21 ENCOUNTER — Other Ambulatory Visit: Payer: Self-pay | Admitting: Physician Assistant

## 2022-11-23 DIAGNOSIS — E781 Pure hyperglyceridemia: Secondary | ICD-10-CM | POA: Diagnosis not present

## 2022-11-23 DIAGNOSIS — Z23 Encounter for immunization: Secondary | ICD-10-CM | POA: Diagnosis not present

## 2022-11-23 DIAGNOSIS — R7303 Prediabetes: Secondary | ICD-10-CM | POA: Diagnosis not present

## 2022-11-23 DIAGNOSIS — I2510 Atherosclerotic heart disease of native coronary artery without angina pectoris: Secondary | ICD-10-CM | POA: Diagnosis not present

## 2022-11-23 DIAGNOSIS — I10 Essential (primary) hypertension: Secondary | ICD-10-CM | POA: Diagnosis not present

## 2023-01-25 ENCOUNTER — Encounter: Payer: Self-pay | Admitting: Cardiovascular Disease

## 2023-01-25 ENCOUNTER — Ambulatory Visit: Payer: Medicare PPO | Attending: Cardiovascular Disease | Admitting: Cardiovascular Disease

## 2023-01-25 VITALS — BP 120/64 | HR 65 | Ht 67.0 in | Wt 191.0 lb

## 2023-01-25 DIAGNOSIS — E785 Hyperlipidemia, unspecified: Secondary | ICD-10-CM

## 2023-01-25 DIAGNOSIS — I2510 Atherosclerotic heart disease of native coronary artery without angina pectoris: Secondary | ICD-10-CM

## 2023-01-25 DIAGNOSIS — N529 Male erectile dysfunction, unspecified: Secondary | ICD-10-CM

## 2023-01-25 DIAGNOSIS — I10 Essential (primary) hypertension: Secondary | ICD-10-CM

## 2023-01-25 NOTE — Assessment & Plan Note (Signed)
Stable with no symptoms of angina.  Continue aspirin for antiplatelet therapy, statin drug with atorvastatin 40 mg, and lisinopril.

## 2023-01-25 NOTE — Patient Instructions (Signed)

## 2023-01-25 NOTE — Assessment & Plan Note (Signed)
Blood pressure well-controlled on lisinopril.  He maintains a healthy lifestyle.  He is exercising regularly and watching his diet carefully.

## 2023-01-25 NOTE — Progress Notes (Signed)
Cardiology Office Note:    Date:  01/25/2023   ID:  Randall Jimenez, DOB 06-21-1955, MRN [id number]  PCP:  Catha Gosselin, MD   [facility] Providers Cardiologist:  Tonny Bollman, MD     Referring MD: Catha Gosselin, MD   Chief Complaint  Patient presents with   Coronary Artery Disease    History of Present Illness:    Randall Jimenez is a 67 y.o. male with a hx of:  Coronary artery disease  Ant STEMI in 5/22 s/p DES to LAD C/b VF arrest Hypertension  Hyperlipidemia   The patient is here alone today. He is doing well. Exercising regularly and has no exertional symptoms other than 'aches and pains.' Today, he denies symptoms of palpitations, chest pain, shortness of breath, orthopnea, PND, lower extremity edema, dizziness, or syncope. Now following with Dr Kateri Plummer with [facility] at [place].   Current Medications: Current Meds  Medication Sig   aspirin 81 MG EC tablet Take 1 tablet (81 mg total) by mouth daily. Swallow whole.   atorvastatin (LIPITOR) 40 MG tablet TAKE 1 TABLET BY MOUTH EVERY DAY   lisinopril (ZESTRIL) 5 MG tablet Take 5 mg by mouth daily.   sildenafil (REVATIO) 20 MG tablet Take 2-5 tablets by mouth as needed     Allergies:   Penicillins   ROS:   Please see the history of present illness.    All other systems reviewed and are negative.  EKGs/Labs/Other Studies Reviewed:    The following studies were reviewed today: Cardiac Studies & Procedures   CARDIAC CATHETERIZATION  CARDIAC CATHETERIZATION 07/13/2020  Narrative  Prox LAD lesion is 99% stenosed.  A drug-eluting stent was successfully placed using a SYNERGY XD 3.50X20.  Post intervention, there is a 0% residual stenosis.  There is mild left ventricular systolic dysfunction.  LV end diastolic pressure is normal.  The left ventricular ejection fraction is 50-55% by visual estimate.  There is no mitral valve regurgitation.  1.  Acute anterior STEMI secondary to  thrombotic subtotal occlusion of the proximal LAD with TIMI I flow, treated successfully with primary PCI using a 3.5 x 20 mm Synergy DES 2.  Patent left circumflex (dominant vessel) with no obstructive disease 3.  Patent nondominant RCA with no obstructive disease 4.  Mild hypokinesis of the distal anterolateral and apical walls, LVEF estimated at 50 to 55% 5.  Ventricular fibrillation x3 in the Cath Lab prior to PCI, defibrillated x3, treated also with lidocaine and amiodarone.  Recommendations: Continue Aggrastat x2 hours, continue amiodarone until tomorrow morning, initiate post MI medical therapy as tolerated.  Recommend ICU care until tomorrow and as long as hemodynamically stable he can move to a telemetry bed and would be eligible for hospital discharge Monday morning.  Check 2D echo.  Continue aspirin and ticagrelor x12 months without interruption.  Findings Coronary Findings Diagnostic  Dominance: Left  Left Anterior Descending There is mild diffuse disease throughout the vessel. Prox LAD lesion is 99% stenosed. The lesion is heavily thrombotic. The lesion is moderately calcified. TIMI-1 flow at baseline  Left Circumflex There is mild diffuse disease throughout the vessel.  Intervention  Prox LAD lesion Stent CATH LAUNCHER 6FR EBU3.5 guide catheter was inserted. Lesion crossed with guidewire using a WIRE COUGAR XT STRL 190CM. Pre-stent angioplasty was performed using a BALLOON SAPPHIRE 2.5X15. A drug-eluting stent was successfully placed using a SYNERGY XD 3.50X20. Post-stent angioplasty was performed using a BALLOON SAPPHIRE  3.75X10. Maximum pressure:  18 atm. Post-Intervention Lesion Assessment  The intervention was successful. Pre-interventional TIMI flow is 1. Post-intervention TIMI flow is 3. No complications occurred at this lesion. There is a 0% residual stenosis post intervention.     ECHOCARDIOGRAM  ECHOCARDIOGRAM COMPLETE 07/14/2020  Narrative ECHOCARDIOGRAM  REPORT    Patient Name:   Randall Jimenez Date of Exam: 07/14/2020 Medical Rec #:  [id number]               Height:       67.0 in Accession #:    [id number]              Weight:       192.0 lb Date of Birth:  1955/10/30                BSA:          1.987 m Patient Age:    65 years                BP:           123/70 mmHg Patient Gender: M                       HR:           57 bpm. Exam Location:  Inpatient  Procedure: 2D Echo, 3D Echo, Cardiac Doppler and Color Doppler  Indications:    121-121.4 ST elevation (STEMI) and non-ST elevation (NSTEMI) myocardial infarction  History:        Patient has no prior history of Echocardiogram examinations. Acute MI and CAD, Abnormal ECG, Arrythmias:Ventricular Fibrillation, Signs/Symptoms:Chest Pain; Risk Factors:Hypertension.  Sonographer:    Sheralyn Boatman RDCS Referring Phys: [phone] Caroleann Casler  IMPRESSIONS   1. Left ventricular ejection fraction, by estimation, is 55 to 60%. Left ventricular ejection fraction by 3D volume is 57 %. The left ventricle has normal function. The left ventricle has no regional wall motion abnormalities. Left ventricular diastolic parameters were normal. 2. Right ventricular systolic function is normal. The right ventricular size is normal. 3. The mitral valve is normal in structure. Trivial mitral valve regurgitation. No evidence of mitral stenosis. 4. The aortic valve is normal in structure. Aortic valve regurgitation is not visualized. No aortic stenosis is present. 5. The inferior vena cava is normal in size with greater than 50% respiratory variability, suggesting right atrial pressure of 3 mmHg.  FINDINGS Left Ventricle: Left ventricular ejection fraction, by estimation, is 55 to 60%. Left ventricular ejection fraction by 3D volume is 57 %. The left ventricle has normal function. The left ventricle has no regional wall motion abnormalities. The left ventricular internal cavity size was normal in size.  There is no left ventricular hypertrophy. Left ventricular diastolic parameters were normal. Normal left ventricular filling pressure.  Right Ventricle: The right ventricular size is normal. No increase in right ventricular wall thickness. Right ventricular systolic function is normal.  Left Atrium: Left atrial size was normal in size.  Right Atrium: Right atrial size was normal in size.  Pericardium: There is no evidence of pericardial effusion.  Mitral Valve: The mitral valve is normal in structure. Trivial mitral valve regurgitation. No evidence of mitral valve stenosis.  Tricuspid Valve: The tricuspid valve is normal in structure. Tricuspid valve regurgitation is not demonstrated. No evidence of tricuspid stenosis.  Aortic Valve: The aortic valve is normal in structure. Aortic valve regurgitation is not visualized. No aortic stenosis is present.  Pulmonic Valve: The pulmonic valve was normal in structure. Pulmonic valve regurgitation is not  visualized. No evidence of pulmonic stenosis.  Aorta: The aortic root is normal in size and structure.  Venous: The inferior vena cava is normal in size with greater than 50% respiratory variability, suggesting right atrial pressure of 3 mmHg.  IAS/Shunts: No atrial level shunt detected by color flow Doppler.   LEFT VENTRICLE PLAX 2D LVIDd:         4.80 cm         Diastology LVIDs:         3.30 cm         LV e' medial:    8.05 cm/s LV PW:         0.90 cm         LV E/e' medial:  7.7 LV IVS:        0.90 cm         LV e' lateral:   14.60 cm/s LVOT diam:     2.20 cm         LV E/e' lateral: 4.2 LV SV:         73 LV SV Index:   37 LVOT Area:     3.80 cm        3D Volume EF LV 3D EF:    Left ventricular LV Volumes (MOD)                            ejection LV vol d, MOD    86.2 ml                    fraction by A2C:                                        3D volume LV vol d, MOD    69.5 ml                    is 57 %. A4C: LV vol s, MOD     37.1 ml A2C:                           3D Volume EF: LV vol s, MOD    30.4 ml       3D EF:        57 % A4C: LV SV MOD A2C:   49.1 ml LV SV MOD A4C:   69.5 ml LV SV MOD BP:    43.3 ml  RIGHT VENTRICLE             IVC RV S prime:     11.00 cm/s  IVC diam: 2.00 cm TAPSE (M-mode): 2.8 cm  LEFT ATRIUM           Index       RIGHT ATRIUM          Index LA diam:      3.00 cm 1.51 cm/m  RA Area:     9.91 cm LA Vol (A2C): 22.9 ml 11.52 ml/m RA Volume:   15.60 ml 7.85 ml/m LA Vol (A4C): 30.3 ml 15.25 ml/m AORTIC VALVE LVOT Vmax:   102.00 cm/s LVOT Vmean:  76.300 cm/s LVOT VTI:    0.193 m  AORTA Ao Root diam: 3.50 cm Ao Asc diam:  3.50 cm  MITRAL VALVE MV Area (PHT): 4.60 cm    SHUNTS MV Decel  Time: 165 msec    Systemic VTI:  0.19 m MV E velocity: 61.70 cm/s  Systemic Diam: 2.20 cm MV A velocity: 50.00 cm/s MV E/A ratio:  1.23  Mihai Croitoru MD Electronically signed by Thurmon Fair MD Signature Date/Time: 07/14/2020/1:08:46 PM    Final             EKG:        Recent Labs: No results found for requested labs within last 365 days.  Recent Lipid Panel    Component Value Date/Time   CHOL 100 09/03/2020 0807   TRIG 74 09/03/2020 0807   HDL 47 09/03/2020 0807   CHOLHDL 2.1 09/03/2020 0807   CHOLHDL 2.8 07/13/2020 1202   VLDL 18 07/13/2020 1202   LDLCALC 38 09/03/2020 0807     Risk Assessment/Calculations:                Physical Exam:    VS:  BP 120/64 (BP Location: Right Arm, Patient Position: Sitting, Cuff Size: Normal)   Pulse 65   Ht 5\' 7"  (1.702 m)   Wt 191 lb (86.6 kg)   SpO2 98%   BMI 29.91 kg/m     Wt Readings from Last 3 Encounters:  01/25/23 191 lb (86.6 kg)  01/19/22 191 lb 6.4 oz (86.8 kg)  07/11/21 190 lb 9.6 oz (86.5 kg)     GEN:  Well nourished, well developed in no acute distress HEENT: Normal NECK: No JVD; No carotid bruits LYMPHATICS: No lymphadenopathy CARDIAC: RRR, no murmurs, rubs, gallops RESPIRATORY:  Clear to  auscultation without rales, wheezing or rhonchi  ABDOMEN: Soft, non-tender, non-distended MUSCULOSKELETAL:  No edema; No deformity  SKIN: Warm and dry NEUROLOGIC:  Alert and oriented x 3 PSYCHIATRIC:  Normal affect   Assessment & Plan Coronary artery disease involving native coronary artery of native heart without angina pectoris Stable with no symptoms of angina.  Continue aspirin for antiplatelet therapy, statin drug with atorvastatin 40 mg, and lisinopril. Essential hypertension Blood pressure well-controlled on lisinopril.  He maintains a healthy lifestyle.  He is exercising regularly and watching his diet carefully. Hyperlipidemia LDL goal <70 LDL is 45.  Continue atorvastatin 40 mg daily. Erectile dysfunction, unspecified erectile dysfunction type He requests a refill for sildenafil.  Orders written.            Medication Adjustments/Labs and Tests Ordered: Current medicines are reviewed at length with the patient today.  Concerns regarding medicines are outlined above.  No orders of the defined types were placed in this encounter.  No orders of the defined types were placed in this encounter.   There are no Patient Instructions on file for this visit.   Signed, Tonny Bollman, MD  01/25/2023 8:24 AM    [facility]

## 2023-01-25 NOTE — Assessment & Plan Note (Signed)
LDL is 45.  Continue atorvastatin 40 mg daily.

## 2023-05-11 ENCOUNTER — Other Ambulatory Visit: Payer: Self-pay | Admitting: Cardiovascular Disease

## 2023-05-11 MED ORDER — SILDENAFIL CITRATE 20 MG PO TABS: ORAL_TABLET | ORAL | 6 refills | Status: AC

## 2023-05-11 NOTE — Telephone Encounter (Signed)
 Pt is asking for a refill. As this is not a cardiac RX does Dr. Excell Seltzer want to proceed with a refill? Please advise.

## 2023-05-11 NOTE — Telephone Encounter (Signed)
*  STAT* If patient is at the pharmacy, call can be transferred to refill team.   1. Which medications need to be refilled? (please list name of each medication and dose if known) Sildenafil   2. Would you like to learn more about the convenience, safety, & potential cost savings by using the [facility]?     3. Are you open to using the [facility] (Type [facility].    4. Which pharmacy/location (including street and city if local pharmacy) is medication to be sent to? [facility] [address] [employer]   5. Do they need a 30 day or 90 day supply? 90 days and refills

## 2023-05-11 NOTE — Telephone Encounter (Signed)
 Per OV note on 01/25/23 by Excell Seltzer:  Erectile dysfunction, unspecified erectile dysfunction type He requests a refill for sildenafil.  Orders written.  Pt advised to follow-up in 1 year. Will send refill in at this time.

## 2023-06-07 DIAGNOSIS — J392 Other diseases of pharynx: Secondary | ICD-10-CM | POA: Diagnosis not present

## 2023-06-07 DIAGNOSIS — I2510 Atherosclerotic heart disease of native coronary artery without angina pectoris: Secondary | ICD-10-CM | POA: Diagnosis not present

## 2023-06-07 DIAGNOSIS — N1831 Chronic kidney disease, stage 3a: Secondary | ICD-10-CM | POA: Diagnosis not present

## 2023-06-07 DIAGNOSIS — L821 Other seborrheic keratosis: Secondary | ICD-10-CM | POA: Diagnosis not present

## 2023-06-07 DIAGNOSIS — N529 Male erectile dysfunction, unspecified: Secondary | ICD-10-CM | POA: Diagnosis not present

## 2023-06-07 DIAGNOSIS — D225 Melanocytic nevi of trunk: Secondary | ICD-10-CM | POA: Diagnosis not present

## 2023-06-07 DIAGNOSIS — Z0000 Encounter for general adult medical examination without abnormal findings: Secondary | ICD-10-CM | POA: Diagnosis not present

## 2023-06-07 DIAGNOSIS — L814 Other melanin hyperpigmentation: Secondary | ICD-10-CM | POA: Diagnosis not present

## 2023-06-07 DIAGNOSIS — E781 Pure hyperglyceridemia: Secondary | ICD-10-CM | POA: Diagnosis not present

## 2023-06-07 DIAGNOSIS — I10 Essential (primary) hypertension: Secondary | ICD-10-CM | POA: Diagnosis not present

## 2023-06-07 DIAGNOSIS — Z125 Encounter for screening for malignant neoplasm of prostate: Secondary | ICD-10-CM | POA: Diagnosis not present

## 2023-06-07 DIAGNOSIS — R7303 Prediabetes: Secondary | ICD-10-CM | POA: Diagnosis not present

## 2023-08-11 DIAGNOSIS — H35371 Puckering of macula, right eye: Secondary | ICD-10-CM | POA: Diagnosis not present

## 2023-08-11 DIAGNOSIS — H5203 Hypermetropia, bilateral: Secondary | ICD-10-CM | POA: Diagnosis not present

## 2023-08-11 DIAGNOSIS — H401131 Primary open-angle glaucoma, bilateral, mild stage: Secondary | ICD-10-CM | POA: Diagnosis not present

## 2023-08-11 DIAGNOSIS — H2513 Age-related nuclear cataract, bilateral: Secondary | ICD-10-CM | POA: Diagnosis not present

## 2023-08-18 ENCOUNTER — Ambulatory Visit (INDEPENDENT_AMBULATORY_CARE_PROVIDER_SITE_OTHER): Admitting: Otolaryngology

## 2023-08-18 ENCOUNTER — Encounter (INDEPENDENT_AMBULATORY_CARE_PROVIDER_SITE_OTHER): Payer: Self-pay | Admitting: Otolaryngology

## 2023-08-18 VITALS — BP 121/78 | HR 73

## 2023-08-18 DIAGNOSIS — R0981 Nasal congestion: Secondary | ICD-10-CM | POA: Diagnosis not present

## 2023-08-18 DIAGNOSIS — J3089 Other allergic rhinitis: Secondary | ICD-10-CM

## 2023-08-18 DIAGNOSIS — R09A2 Foreign body sensation, throat: Secondary | ICD-10-CM | POA: Diagnosis not present

## 2023-08-18 DIAGNOSIS — J343 Hypertrophy of nasal turbinates: Secondary | ICD-10-CM

## 2023-08-18 DIAGNOSIS — J342 Deviated nasal septum: Secondary | ICD-10-CM

## 2023-08-18 DIAGNOSIS — J3489 Other specified disorders of nose and nasal sinuses: Secondary | ICD-10-CM

## 2023-08-18 DIAGNOSIS — R0982 Postnasal drip: Secondary | ICD-10-CM

## 2023-08-18 DIAGNOSIS — K219 Gastro-esophageal reflux disease without esophagitis: Secondary | ICD-10-CM

## 2023-08-18 MED ORDER — LEVOCETIRIZINE DIHYDROCHLORIDE 5 MG PO TABS: 5.0000 mg | ORAL_TABLET | Freq: Every evening | ORAL | 3 refills | Status: AC

## 2023-08-18 MED ORDER — FLUTICASONE PROPIONATE 50 MCG/ACT NA SUSP: 2.0000 | Freq: Every day | NASAL | 6 refills | Status: AC

## 2023-08-18 NOTE — Progress Notes (Signed)
 ENT CONSULT:  Reason for Consult: throat irritation  itch on the right side of the throat  HPI: Discussed the use of AI scribe software for clinical note transcription with the patient, who gave verbal consent to proceed.  History of Present Illness Randall Jimenez is a 68 year old male who presents with throat irritation and post-nasal drainage issues.  He experiences intermittent throat irritation, described as an itch on the right side of his throat. This sensation varies in severity and is sometimes accompanied by a feeling of something stuck in his throat, which he can alleviate by coughing. No heartburn or reflux symptoms are present. The mucus he coughs up is usually clear.  He has a history of nasal drainage issues and performs daily sinus rinses with salt and warm water. He reports a sensation of blockage on the right side of his nose, which he attributes to sinus problems. He denies any history of sinus surgeries and does not take any medications for his sinus issues, relying solely on nasal irrigation. No sinus infections. He drinks apple cider vinegar in the morning.      Records Reviewed:  Admitted for STEMI in 2022  Cath: 07/13/20   Prox LAD lesion is 99% stenosed. A drug-eluting stent was successfully placed using a SYNERGY XD 3.50X20. Post intervention, there is a 0% residual stenosis. There is mild left ventricular systolic dysfunction. LV end diastolic pressure is normal. The left ventricular ejection fraction is 50-55% by visual estimate. There is no mitral valve regurgitation.   1.  Acute anterior STEMI secondary to thrombotic subtotal occlusion of the proximal LAD with TIMI I flow, treated successfully with primary PCI using a 3.5 x 20 mm Synergy DES 2.  Patent left circumflex (dominant vessel) with no obstructive disease 3.  Patent nondominant RCA with no obstructive disease 4.  Mild hypokinesis of the distal anterolateral and apical walls, LVEF estimated  at 50 to 55% 5.  Ventricular fibrillation x3 in the Cath Lab prior to PCI, defibrillated x3, treated also with lidocaine  and amiodarone .     Past Medical History:  Diagnosis Date   Coronary artery disease 07/13/2020   S/p anterior STEMI in May 2022 treated with a DES to the LAD Echo May 2022: EF 55-60   Hyperlipidemia    Hypertension     Past Surgical History:  Procedure Laterality Date   CORONARY/GRAFT ACUTE MI REVASCULARIZATION N/A 07/13/2020   Procedure: Coronary/Graft Acute MI Revascularization;  Surgeon: Arnoldo Lapping, MD;  Location[facility]AS[facility] CV LAB;  Service: Cardiovascular;  Laterality: N/A;   LEFT HEART CATH AND CORONARY ANGIOGRAPHY N/A 07/13/2020   Procedure: LEFT HEART CATH AND CORONARY ANGIOGRAPHY;  Surgeon: Arnoldo Lapping, MD;  Location: [facility] INVASIVE CV LAB;  Service: Cardiovascular;  Laterality: N/A;    Family History  Problem Relation Age of Onset   CAD Neg Hx     Social History:  reports that he has never smoked. He has never used smokeless tobacco. He reports that he does not use drugs. No history on file for alcohol use.  Allergies:  Allergies  Allergen Reactions   Penicillins Itching    Medications: I have reviewed the patient's current medications.  The PMH, PSH, Medications, Allergies, and SH were reviewed and updated.  ROS: Constitutional: Negative for fever, weight loss and weight gain. Cardiovascular: Negative for chest pain and dyspnea on exertion. Respiratory: Is not experiencing shortness of breath at rest. Gastrointestinal: Negative for nausea and vomiting. Neurological: Negative for headaches. Psychiatric: The patient is  not nervous/anxious  Blood pressure 121/78, pulse 73, SpO2 96%. There is no height or weight on file to calculate BMI.  PHYSICAL EXAM:  Exam: General: Well-developed, well-nourished Respiratory Respiratory effort: Equal inspiration and expiration without stridor Cardiovascular Peripheral Vascular: Warm extremities  with equal color/perfusion Eyes: No nystagmus with equal extraocular motion bilaterally Neuro/Psych/Balance: Patient oriented to person, place, and time; Appropriate mood and affect; Gait is intact with no imbalance; Cranial nerves I-XII are intact Head and Face Inspection: Normocephalic and atraumatic without mass or lesion Palpation: Facial skeleton intact without bony stepoffs Salivary Glands: No mass or tenderness Facial Strength: Facial motility symmetric and full bilaterally ENT Pinna: External ear intact and fully developed External canal: Canal is patent with intact skin Tympanic Membrane: Clear and mobile External Nose: No scar or anatomic deformity Internal Nose: Septum is S-shaped. No polyp, or purulence. Mucosal edema and erythema present.  Bilateral inferior turbinate hypertrophy.  Lips, Teeth, and gums: Mucosa and teeth intact and viable TMJ: No pain to palpation with full mobility Oral cavity/oropharynx: No erythema or exudate, no lesions present Nasopharynx: No mass or lesion with intact mucosa Hypopharynx: Intact mucosa without pooling of secretions Larynx Glottic: Full true vocal cord mobility without lesion or mass Supraglottic: Normal appearing epiglottis and AE folds Interarytenoid Space: Moderate pachydermia&edema Subglottic Space: Patent without lesion or edema Neck Neck and Trachea: Midline trachea without mass or lesion Thyroid: No mass or nodularity Lymphatics: No lymphadenopathy  Procedure: Preoperative diagnosis: globus sensations  Postoperative diagnosis:   Same  Procedure: Flexible fiberoptic laryngoscopy  Surgeon: Artice Last, MD  Anesthesia: Topical lidocaine  and Afrin Complications: None Condition is stable throughout exam  Indications and consent:  The patient presents to the clinic with above symptoms. Indirect laryngoscopy view was incomplete. Thus it was recommended that they undergo a flexible fiberoptic laryngoscopy. All of the  risks, benefits, and potential complications were reviewed with the patient preoperatively and verbal informed consent was obtained.  Procedure: The patient was seated upright in the clinic. Topical lidocaine  and Afrin were applied to the nasal cavity. After adequate anesthesia had occurred, I then proceeded to pass the flexible telescope into the nasal cavity. The nasal cavity was patent without rhinorrhea or polyp. The nasopharynx was also patent without mass or lesion. The base of tongue was visualized and was normal. There were no signs of pooling of secretions in the piriform sinuses. The true vocal folds were mobile bilaterally. There were no signs of glottic or supraglottic mucosal lesion or mass. There was moderate interarytenoid pachydermia and post cricoid edema. The telescope was then slowly withdrawn and the patient tolerated the procedure throughout.    Studies Reviewed: CXR 07/13/20 IMPRESSION: Mild left basilar atelectasis/airspace disease. Mild pulmonary vascular congestion.  Assessment/Plan: Encounter Diagnoses  Name Primary?   Chronic nasal congestion Yes   Post-nasal drip    Chronic GERD    Environmental and seasonal allergies    Hypertrophy of both inferior nasal turbinates    Nasal septal deviation    Globus sensation    Nasal obstruction     Assessment and Plan Assessment & Plan Globus and GERD LPR Intermittent right-sided throat irritation likely due to silent reflux. Potential triggers include apple cider and diet Flexible scope exam without masses or lesions, but he did have clear post-nasal drainage and findings c/w GERD LPR - Instruct on dietary and lifestyle modifications to manage reflux, including avoiding carbonated beverages, caffeinated beverages, spicy foods, and fried foods. - Recommend a supplement available on [employer]  after meals, especially after dinner. - stop apple cider vinegar  Chronic Nasal Congestion and Post-nasal Drainage  Environmental Allergies  Right-sided nasal congestion with blockage sensation. Possible deviated septum and natural nasal cycle as well likely environmental allergies. Drainage issues likely due to allergies, not sinus problems since he never had a sinus infection requiring antibiotics  - Prescribed Xyzal 5 mg at bedtime and nasal spray (Flonase) to manage nasal congestion and drainage. - consider allergy testing in the future       Thank you for allowing me to participate in the care of this patient. Please do not hesitate to contact me with any questions or concerns.   Artice Last, MD Otolaryngology [facility] Phone: [phone] Fax: [phone]    08/18/2023, 1:24 PM

## 2023-08-18 NOTE — Patient Instructions (Signed)

## 2023-08-20 ENCOUNTER — Other Ambulatory Visit: Payer: Self-pay | Admitting: Cardiovascular Disease

## 2023-08-27 DIAGNOSIS — H401131 Primary open-angle glaucoma, bilateral, mild stage: Secondary | ICD-10-CM | POA: Diagnosis not present

## 2023-09-30 DIAGNOSIS — H401111 Primary open-angle glaucoma, right eye, mild stage: Secondary | ICD-10-CM | POA: Diagnosis not present

## 2023-10-07 DIAGNOSIS — H401134 Primary open-angle glaucoma, bilateral, indeterminate stage: Secondary | ICD-10-CM | POA: Diagnosis not present

## 2023-10-07 DIAGNOSIS — H2513 Age-related nuclear cataract, bilateral: Secondary | ICD-10-CM | POA: Diagnosis not present

## 2023-10-07 DIAGNOSIS — H43813 Vitreous degeneration, bilateral: Secondary | ICD-10-CM | POA: Diagnosis not present

## 2023-10-07 DIAGNOSIS — H43393 Other vitreous opacities, bilateral: Secondary | ICD-10-CM | POA: Diagnosis not present

## 2023-10-07 DIAGNOSIS — H35413 Lattice degeneration of retina, bilateral: Secondary | ICD-10-CM | POA: Diagnosis not present

## 2023-10-07 DIAGNOSIS — H35371 Puckering of macula, right eye: Secondary | ICD-10-CM | POA: Diagnosis not present

## 2023-11-11 DIAGNOSIS — H401131 Primary open-angle glaucoma, bilateral, mild stage: Secondary | ICD-10-CM | POA: Diagnosis not present

## 2023-11-26 ENCOUNTER — Other Ambulatory Visit: Payer: Self-pay | Admitting: Medical Genetics

## 2023-11-29 ENCOUNTER — Telehealth: Payer: Self-pay

## 2023-11-29 NOTE — Telephone Encounter (Signed)
 Called pt to schedule pre-op appointment

## 2023-11-29 NOTE — Telephone Encounter (Signed)
  Patient Consent for Virtual Visit        Randall Jimenez has provided verbal consent on 11/29/2023 for a virtual visit (video or telephone).   CONSENT FOR VIRTUAL VISIT FOR:  Randall Jimenez  By participating in this virtual visit I agree to the following:  I hereby voluntarily request, consent and authorize [facility] and its employed or contracted physicians, physician assistants, nurse practitioners or other licensed health care professionals (the Practitioner), to provide me with telemedicine health care services (the "Services) as deemed necessary by the treating Practitioner. I acknowledge and consent to receive the Services by the Practitioner via telemedicine. I understand that the telemedicine visit will involve communicating with the Practitioner through live audiovisual communication technology and the disclosure of certain medical information by electronic transmission. I acknowledge that I have been given the opportunity to request an in-person assessment or other available alternative prior to the telemedicine visit and am voluntarily participating in the telemedicine visit.  I understand that I have the right to withhold or withdraw my consent to the use of telemedicine in the course of my care at any time, without affecting my right to future care or treatment, and that the Practitioner or I may terminate the telemedicine visit at any time. I understand that I have the right to inspect all information obtained and/or recorded in the course of the telemedicine visit and may receive copies of available information for a reasonable fee.  I understand that some of the potential risks of receiving the Services via telemedicine include:  Delay or interruption in medical evaluation due to technological equipment failure or disruption; Information transmitted may not be sufficient (e.g. poor resolution of images) to allow for appropriate medical decision making by  the Practitioner; and/or  In rare instances, security protocols could fail, causing a breach of personal health information.  Furthermore, I acknowledge that it is my responsibility to provide information about my medical history, conditions and care that is complete and accurate to the best of my ability. I acknowledge that Practitioner's advice, recommendations, and/or decision may be based on factors not within their control, such as incomplete or inaccurate data provided by me or distortions of diagnostic images or specimens that may result from electronic transmissions. I understand that the practice of medicine is not an exact science and that Practitioner makes no warranties or guarantees regarding treatment outcomes. I acknowledge that a copy of this consent can be made available to me via my patient portal [facility] MyChart), or I can request a printed copy by calling the office of [facility].    I understand that my insurance will be billed for this visit.   I have read or had this consent read to me. I understand the contents of this consent, which adequately explains the benefits and risks of the Services being provided via telemedicine.  I have been provided ample opportunity to ask questions regarding this consent and the Services and have had my questions answered to my satisfaction. I give my informed consent for the services to be provided through the use of telemedicine in my medical care

## 2023-11-29 NOTE — Telephone Encounter (Signed)
   Pre-operative Risk Assessment    Patient Name: Randall Jimenez  DOB: May 14, 1955 MRN: [id number]   Date of last office visit: 01/25/23 Date of next office visit: 02/01/24   Request for Surgical Clearance    Procedure:  Vitrectomy membrane peel  Date of Surgery:  Clearance 12/08/23                                Surgeon:  Rankin Burke, MD Surgeon's Group or Practice Name:  [facility] Phone number:  [phone] Fax number:  [phone]   Type of Clearance Requested:   - Medical  - Pharmacy:  Hold Aspirin      Type of Anesthesia:  Not Indicated   Additional requests/questions:    [name]   11/29/2023, 3:34 PM

## 2023-11-29 NOTE — Telephone Encounter (Signed)
   Name: Randall Jimenez  DOB: 1956/02/01  MRN: [id number]  Primary Cardiologist: Ozell Fell, MD   Preoperative team, please contact this patient and set up a phone call appointment for further preoperative risk assessment. Please obtain consent and complete medication review. Thank you for your help.  Patient has a history of CAD with STEMI in 07/2020 s/p DES to LAD. Therefore, recommended patient stay on Aspirin  perioperatively if possible.   I confirmed the patient resides in the state of [place] . As per [facility] Medical Board telemedicine laws, the patient must reside in the state in which the provider is licensed.   Liliahna Cudd E Sherley Mckenney, PA-C 11/29/2023, 3:59 PM [facility]

## 2023-12-01 DIAGNOSIS — Z860101 Personal history of adenomatous and serrated colon polyps: Secondary | ICD-10-CM | POA: Diagnosis not present

## 2023-12-01 DIAGNOSIS — D124 Benign neoplasm of descending colon: Secondary | ICD-10-CM | POA: Diagnosis not present

## 2023-12-01 DIAGNOSIS — K5730 Diverticulosis of large intestine without perforation or abscess without bleeding: Secondary | ICD-10-CM | POA: Diagnosis not present

## 2023-12-01 DIAGNOSIS — D120 Benign neoplasm of cecum: Secondary | ICD-10-CM | POA: Diagnosis not present

## 2023-12-01 DIAGNOSIS — K635 Polyp of colon: Secondary | ICD-10-CM | POA: Diagnosis not present

## 2023-12-01 DIAGNOSIS — D122 Benign neoplasm of ascending colon: Secondary | ICD-10-CM | POA: Diagnosis not present

## 2023-12-01 DIAGNOSIS — D125 Benign neoplasm of sigmoid colon: Secondary | ICD-10-CM | POA: Diagnosis not present

## 2023-12-01 DIAGNOSIS — Z09 Encounter for follow-up examination after completed treatment for conditions other than malignant neoplasm: Secondary | ICD-10-CM | POA: Diagnosis not present

## 2023-12-02 ENCOUNTER — Ambulatory Visit: Attending: Cardiology | Admitting: Emergency Medicine

## 2023-12-02 DIAGNOSIS — Z01810 Encounter for preprocedural cardiovascular examination: Secondary | ICD-10-CM | POA: Diagnosis not present

## 2023-12-02 NOTE — Progress Notes (Signed)
 Virtual Visit via Telephone Note   Because of Ankush Kem co-morbid illnesses, he is at least at moderate risk for complications without adequate follow up.  This format is felt to be most appropriate for this patient at this time.  Due to technical limitations with video connection (technology), today's appointment will be conducted as an audio only telehealth visit, and Randall Jimenez verbally agreed to proceed in this manner.   All issues noted in this document were discussed and addressed.  No physical exam could be performed with this format.  Evaluation Performed:  Preoperative cardiovascular risk assessment _____________   Date:  12/02/2023   Patient ID:  Randall Jimenez, DOB [birth year], MRN [id number] Patient Location:  Home Provider location:   Office  Primary Care Provider:  Morgan Drivers, MD Primary Cardiologist:  Ozell Fell, MD  Chief Complaint / Patient Profile   68 y.o. y/o male with a h/o coronary artery disease, hypertension, hyperlipidemia, erectile dysfunction who is pending vitrectomy membrane peel on 12/08/2023 with [place] retina specialist and presents today for telephonic preoperative cardiovascular risk assessment.  History of Present Illness    Randall Jimenez is a 68 y.o. male who presents via audio/video conferencing for a telehealth visit today.  Pt was last seen in cardiology clinic on 01/25/2023 by Dr. Fell.  At that time Randall Jimenez was doing well without anginal symptoms.  The patient is now pending procedure as outlined above. Since his last visit, he denies chest pain, shortness of breath, lower extremity edema, fatigue, palpitations, melena, hematuria, hemoptysis, diaphoresis, weakness, presyncope, syncope, orthopnea, and PND.  Today patient is doing well.  He is without any acute cardiovascular concerns today.  He remains very active as he goes to the gym daily, goes for walks often, and even plays  soccer every Sunday without any exertional chest pains or dyspnea.  Overall no anginal symptoms.  He is easily able to complete greater than 4 METS.  Past Medical History    Past Medical History:  Diagnosis Date   Coronary artery disease 07/13/2020   S/p anterior STEMI in May 2022 treated with a DES to the LAD Echo May 2022: EF 55-60   Hyperlipidemia    Hypertension    Past Surgical History:  Procedure Laterality Date   CORONARY/GRAFT ACUTE MI REVASCULARIZATION N/A 07/13/2020   Procedure: Coronary/Graft Acute MI Revascularization;  Surgeon: Fell Ozell, MD;  Location: [facility] INVASIVE CV LAB;  Service: Cardiovascular;  Laterality: N/A;   LEFT HEART CATH AND CORONARY ANGIOGRAPHY N/A 07/13/2020   Procedure: LEFT HEART CATH AND CORONARY ANGIOGRAPHY;  Surgeon: Fell Ozell, MD;  Location: [facility] INVASIVE CV LAB;  Service: Cardiovascular;  Laterality: N/A;    Allergies  Allergies  Allergen Reactions   Penicillins Itching    Home Medications    Prior to Admission medications   Medication Sig Start Date End Date Taking? Authorizing Provider  aspirin  81 MG EC tablet Take 1 tablet (81 mg total) by mouth daily. Swallow whole. 07/15/20   Fell Ozell, MD  atorvastatin  (LIPITOR ) 40 MG tablet TAKE 1 TABLET BY MOUTH EVERY DAY 08/20/23   Fell Ozell, MD  fluticasone  (FLONASE ) 50 MCG/ACT nasal spray Place 2 sprays into both nostrils daily. 08/18/23   Soldatova, Liuba, MD  levocetirizine (XYZAL  ALLERGY 24HR) 5 MG tablet Take 1 tablet (5 mg total) by mouth every evening. 08/18/23   Soldatova, Liuba, MD  lisinopril (ZESTRIL) 5 MG tablet Take 5 mg by mouth daily. Patient not taking: Reported on 08/18/2023    [provider]  losartan (COZAAR) 25 MG tablet Take 25 mg by mouth daily. 06/07/23   [provider]  sildenafil  (REVATIO ) 20 MG tablet Take 2-5 tablets by mouth as needed 05/11/23   Wonda Sharper, MD    Physical Exam    Vital Signs:  Randall Jimenez does not have vital  signs available for review today.  Given telephonic nature of communication, physical exam is limited. AAOx3. NAD. Normal affect.  Speech and respirations are unlabored.  Accessory Clinical Findings    None  Assessment & Plan    1.  Preoperative Cardiovascular Risk Assessment: According to the Revised Cardiac Risk Index (RCRI), his Perioperative Risk of Major Cardiac Event is (%): 0.9. His Functional Capacity in METs is: 7.28 according to the Duke Activity Status Index (DASI).  Therefore, based on ACC/AHA guidelines, patient would be at acceptable risk for the planned procedure without further cardiovascular testing.  The patient was advised that if he develops new symptoms prior to surgery to contact our office to arrange for a follow-up visit, and he verbalized understanding.  Ideally aspirin  should be continued without interruption, however if the bleeding risk is too great, aspirin  may be held for 5-7 days prior to surgery. Please resume aspirin  post operatively when it is felt to be safe from a bleeding standpoint.    A copy of this note will be routed to requesting surgeon.  Time:   Today, I have spent 6 minutes with the patient with telehealth technology discussing medical history, symptoms, and management plan.     Randall LITTIE Louis, NP  12/02/2023, 9:17 AM

## 2023-12-03 DIAGNOSIS — Z23 Encounter for immunization: Secondary | ICD-10-CM | POA: Diagnosis not present

## 2023-12-03 DIAGNOSIS — M950 Acquired deformity of nose: Secondary | ICD-10-CM | POA: Diagnosis not present

## 2023-12-03 DIAGNOSIS — I10 Essential (primary) hypertension: Secondary | ICD-10-CM | POA: Diagnosis not present

## 2023-12-03 DIAGNOSIS — M549 Dorsalgia, unspecified: Secondary | ICD-10-CM | POA: Diagnosis not present

## 2023-12-03 DIAGNOSIS — D125 Benign neoplasm of sigmoid colon: Secondary | ICD-10-CM | POA: Diagnosis not present

## 2023-12-03 DIAGNOSIS — E781 Pure hyperglyceridemia: Secondary | ICD-10-CM | POA: Diagnosis not present

## 2023-12-03 DIAGNOSIS — R7303 Prediabetes: Secondary | ICD-10-CM | POA: Diagnosis not present

## 2023-12-03 DIAGNOSIS — D124 Benign neoplasm of descending colon: Secondary | ICD-10-CM | POA: Diagnosis not present

## 2023-12-03 DIAGNOSIS — I2510 Atherosclerotic heart disease of native coronary artery without angina pectoris: Secondary | ICD-10-CM | POA: Diagnosis not present

## 2023-12-03 DIAGNOSIS — K635 Polyp of colon: Secondary | ICD-10-CM | POA: Diagnosis not present

## 2023-12-03 DIAGNOSIS — R0602 Shortness of breath: Secondary | ICD-10-CM | POA: Diagnosis not present

## 2023-12-03 DIAGNOSIS — N1831 Chronic kidney disease, stage 3a: Secondary | ICD-10-CM | POA: Diagnosis not present

## 2023-12-06 DIAGNOSIS — R0602 Shortness of breath: Secondary | ICD-10-CM | POA: Diagnosis not present

## 2023-12-08 DIAGNOSIS — H35371 Puckering of macula, right eye: Secondary | ICD-10-CM | POA: Diagnosis not present

## 2023-12-08 DIAGNOSIS — H35411 Lattice degeneration of retina, right eye: Secondary | ICD-10-CM | POA: Diagnosis not present

## 2023-12-08 DIAGNOSIS — H33011 Retinal detachment with single break, right eye: Secondary | ICD-10-CM | POA: Diagnosis not present

## 2023-12-16 DIAGNOSIS — H43812 Vitreous degeneration, left eye: Secondary | ICD-10-CM | POA: Diagnosis not present

## 2023-12-16 DIAGNOSIS — Z9889 Other specified postprocedural states: Secondary | ICD-10-CM | POA: Diagnosis not present

## 2023-12-16 DIAGNOSIS — M533 Sacrococcygeal disorders, not elsewhere classified: Secondary | ICD-10-CM | POA: Diagnosis not present

## 2023-12-16 DIAGNOSIS — H35371 Puckering of macula, right eye: Secondary | ICD-10-CM | POA: Diagnosis not present

## 2023-12-16 DIAGNOSIS — H33011 Retinal detachment with single break, right eye: Secondary | ICD-10-CM | POA: Diagnosis not present

## 2024-01-03 DIAGNOSIS — N289 Disorder of kidney and ureter, unspecified: Secondary | ICD-10-CM | POA: Diagnosis not present

## 2024-01-06 DIAGNOSIS — H33011 Retinal detachment with single break, right eye: Secondary | ICD-10-CM | POA: Diagnosis not present

## 2024-01-06 DIAGNOSIS — Z9889 Other specified postprocedural states: Secondary | ICD-10-CM | POA: Diagnosis not present

## 2024-01-06 DIAGNOSIS — H35371 Puckering of macula, right eye: Secondary | ICD-10-CM | POA: Diagnosis not present

## 2024-01-06 DIAGNOSIS — H43812 Vitreous degeneration, left eye: Secondary | ICD-10-CM | POA: Diagnosis not present

## 2024-01-06 DIAGNOSIS — H2513 Age-related nuclear cataract, bilateral: Secondary | ICD-10-CM | POA: Diagnosis not present

## 2024-01-06 DIAGNOSIS — H401131 Primary open-angle glaucoma, bilateral, mild stage: Secondary | ICD-10-CM | POA: Diagnosis not present

## 2024-01-06 DIAGNOSIS — H25813 Combined forms of age-related cataract, bilateral: Secondary | ICD-10-CM | POA: Diagnosis not present

## 2024-01-24 DIAGNOSIS — Z7982 Long term (current) use of aspirin: Secondary | ICD-10-CM | POA: Diagnosis not present

## 2024-01-24 DIAGNOSIS — I2510 Atherosclerotic heart disease of native coronary artery without angina pectoris: Secondary | ICD-10-CM | POA: Diagnosis not present

## 2024-01-24 DIAGNOSIS — N189 Chronic kidney disease, unspecified: Secondary | ICD-10-CM | POA: Diagnosis not present

## 2024-01-24 DIAGNOSIS — I129 Hypertensive chronic kidney disease with stage 1 through stage 4 chronic kidney disease, or unspecified chronic kidney disease: Secondary | ICD-10-CM | POA: Diagnosis not present

## 2024-01-24 DIAGNOSIS — I252 Old myocardial infarction: Secondary | ICD-10-CM | POA: Diagnosis not present

## 2024-01-24 DIAGNOSIS — K219 Gastro-esophageal reflux disease without esophagitis: Secondary | ICD-10-CM | POA: Diagnosis not present

## 2024-01-24 DIAGNOSIS — H409 Unspecified glaucoma: Secondary | ICD-10-CM | POA: Diagnosis not present

## 2024-01-24 DIAGNOSIS — E785 Hyperlipidemia, unspecified: Secondary | ICD-10-CM | POA: Diagnosis not present

## 2024-02-01 ENCOUNTER — Ambulatory Visit: Admitting: Cardiovascular Disease

## 2024-02-08 NOTE — Progress Notes (Unsigned)
 [facility] [address] [place] [address] Phone: [phone] Subjective:   LILLETTE Berwyn Posey, am serving as a scribe for Dr. Arthea Claudene.  I'm seeing this patient by the request  of:  Morgan Drivers, MD  CC: Back pain  YEP:Dlagzrupcz  Randall Jimenez is a 68 y.o. male coming in with complaint of L sided SI joint pain. Patient states that his pain started 5 months ago. He plays soccer on weekends and developed pain in lower L side. Running would make it worse. Pain improves when he stops playing. Also works out daily at gym and running on treadmill is not as painful. Denies any radiating symptoms. Is R leg dominant.   Also c/o pain over tip of R 4th toe when he plays soccer. Has had xray and MRI of the foot. Notices when he stops on foot without shoes he has pain over MTP joint that seems to be radiating into the tip of his toe. Has tired injection which was helpful only for a short time.       Past Medical History:  Diagnosis Date   Coronary artery disease 07/13/2020   S/p anterior STEMI in May 2022 treated with a DES to the LAD Echo May 2022: EF 55-60   Hyperlipidemia    Hypertension    Past Surgical History:  Procedure Laterality Date   CORONARY/GRAFT ACUTE MI REVASCULARIZATION N/A 07/13/2020   Procedure: Coronary/Graft Acute MI Revascularization;  Surgeon: Wonda Sharper, MD;  Location: [facility] INVASIVE CV LAB;  Service: Cardiovascular;  Laterality: N/A;   LEFT HEART CATH AND CORONARY ANGIOGRAPHY N/A 07/13/2020   Procedure: LEFT HEART CATH AND CORONARY ANGIOGRAPHY;  Surgeon: Wonda Sharper, MD;  Location: [facility] INVASIVE CV LAB;  Service: Cardiovascular;  Laterality: N/A;   Social History   Socioeconomic History   Marital status: Married    Spouse name: Not on file   Number of children: Not on file   Years of education: Not on file   Highest education level: Not on file  Occupational History   Not on file  Tobacco Use   Smoking  status: Never   Smokeless tobacco: Never  Substance and Sexual Activity   Alcohol use: Not on file   Drug use: Never   Sexual activity: Not on file  Other Topics Concern   Not on file  Social History Narrative   Not on file   Social Drivers of Health   Financial Resource Strain: Not on file  Food Insecurity: Not on file  Transportation Needs: Not on file  Physical Activity: Not on file  Stress: Not on file  Social Connections: Not on file   Allergies  Allergen Reactions   Penicillins Itching   Family History  Problem Relation Age of Onset   CAD Neg Hx      Current Outpatient Medications (Cardiovascular):    atorvastatin  (LIPITOR ) 40 MG tablet, TAKE 1 TABLET BY MOUTH EVERY DAY   losartan (COZAAR) 25 MG tablet, Take 25 mg by mouth daily.   sildenafil  (REVATIO ) 20 MG tablet, Take 2-5 tablets by mouth as needed   lisinopril (ZESTRIL) 5 MG tablet, Take 5 mg by mouth daily. (Patient not taking: Reported on 08/18/2023)  Current Outpatient Medications (Respiratory):    fluticasone  (FLONASE ) 50 MCG/ACT nasal spray, Place 2 sprays into both nostrils daily.   levocetirizine (XYZAL  ALLERGY 24HR) 5 MG tablet, Take 1 tablet (5 mg total) by mouth every evening.  Current Outpatient Medications (Analgesics):    aspirin   81 MG EC tablet, Take 1 tablet (81 mg total) by mouth daily. Swallow whole.     Reviewed prior external information including notes and imaging from  primary care provider As well as notes that were available from care everywhere and other healthcare systems.  Past medical history, social, surgical and family history all reviewed in electronic medical record.  No pertanent information unless stated regarding to the chief complaint.   Review of Systems:  No headache, visual changes, nausea, vomiting, diarrhea, constipation, dizziness, abdominal pain, skin rash, fevers, chills, night sweats, weight loss, swollen lymph nodes, body aches, joint swelling, chest pain,  shortness of breath, mood changes. POSITIVE muscle aches  Objective  Blood pressure 110/80, pulse 74, height 5' 7 (1.702 m), weight 199 lb (90.3 kg), SpO2 98%.   General: No apparent distress alert and oriented x3 mood and affect normal, dressed appropriately.  HEENT: Pupils equal, extraocular movements intact  Respiratory: Patient's speak in full sentences and does not appear short of breath  Cardiovascular: No lower extremity edema, non tender, no erythema  , Low back exam shows patient does have some mild loss of lordosis.  Good grip strength noted.  Patient has some relatively good core strength.  Tender to palpation over the left sacroiliac joint.  Positive FABER test on the left.  Negative straight leg test noted.  Right foot exam shows breakdown of the transverse arch noted.  Patient is minorly tender over the MCP on the right foot at the fourth toe.  Otherwise fairly unremarkable.  Limited muscular skeletal ultrasound was performed and interpreted by CLAUDENE HUSSAR, M  Limited ultrasound shows some hypoechoic changes in the 4th and 3rd MCP joints.  No cortical irregularity noted.  No sign of any neuroma. 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  Hip abductor strengthening exercises given.  Held on any significant medications.  Patient responded relatively well though to osteopathic manipulation.  Follow-up with me again in 6 to 8 weeks otherwise.  Capsulitis of right foot 3rd and 4th toes.  Breakdown in the transverse arch.  Discussed metatarsal support, proper shoes, avoid being barefoot.  Worsening pain consider injection.  Follow-up again in 6 to 8 weeks    Decision today to treat with OMT was based on Physical Exam  After verbal consent  patient was treated with HVLA, ME, FPR techniques in thoracic, lumbar and sacral areas, all areas are chronic   Patient tolerated the procedure well with improvement in symptoms  Patient given exercises, stretches and lifestyle modifications  See medications in patient instructions if given  Patient will follow up in 4-8 weeks  The above documentation has been reviewed and is accurate and complete Alexsus Papadopoulos M Matika Bartell, DO

## 2024-02-09 ENCOUNTER — Ambulatory Visit: Admitting: Family Medicine

## 2024-02-09 ENCOUNTER — Encounter: Payer: Self-pay | Admitting: Family Medicine

## 2024-02-09 ENCOUNTER — Ambulatory Visit

## 2024-02-09 ENCOUNTER — Other Ambulatory Visit: Payer: Self-pay

## 2024-02-09 VITALS — BP 110/80 | HR 74 | Ht 67.0 in | Wt 199.0 lb

## 2024-02-09 DIAGNOSIS — M778 Other enthesopathies, not elsewhere classified: Secondary | ICD-10-CM | POA: Insufficient documentation

## 2024-02-09 DIAGNOSIS — M545 Low back pain, unspecified: Secondary | ICD-10-CM | POA: Diagnosis not present

## 2024-02-09 DIAGNOSIS — M549 Dorsalgia, unspecified: Secondary | ICD-10-CM | POA: Diagnosis not present

## 2024-02-09 DIAGNOSIS — M533 Sacrococcygeal disorders, not elsewhere classified: Secondary | ICD-10-CM | POA: Insufficient documentation

## 2024-02-09 DIAGNOSIS — M9902 Segmental and somatic dysfunction of thoracic region: Secondary | ICD-10-CM

## 2024-02-09 DIAGNOSIS — M7731 Calcaneal spur, right foot: Secondary | ICD-10-CM | POA: Diagnosis not present

## 2024-02-09 DIAGNOSIS — M79671 Pain in right foot: Secondary | ICD-10-CM

## 2024-02-09 DIAGNOSIS — M48061 Spinal stenosis, lumbar region without neurogenic claudication: Secondary | ICD-10-CM | POA: Diagnosis not present

## 2024-02-09 DIAGNOSIS — M9904 Segmental and somatic dysfunction of sacral region: Secondary | ICD-10-CM

## 2024-02-09 DIAGNOSIS — M9903 Segmental and somatic dysfunction of lumbar region: Secondary | ICD-10-CM

## 2024-02-09 DIAGNOSIS — M47816 Spondylosis without myelopathy or radiculopathy, lumbar region: Secondary | ICD-10-CM | POA: Diagnosis not present

## 2024-02-09 NOTE — Patient Instructions (Addendum)
 Xray today Exercises Rigid soled shoe Recovery sandals HOKA or OOFOS Voltaren to the toe  Ice after activity See me in 6-8 weeks

## 2024-02-09 NOTE — Assessment & Plan Note (Signed)
 3rd and 4th toes.  Breakdown in the transverse arch.  Discussed metatarsal support, proper shoes, avoid being barefoot.  Worsening pain consider injection.  Follow-up again in 6 to 8 weeks

## 2024-02-09 NOTE — Assessment & Plan Note (Signed)
 Likely mild arthritic changes and muscle imbalances.  Hip abductor strengthening exercises given.  Held on any significant medications.  Patient responded relatively well though to osteopathic manipulation.  Follow-up with me again in 6 to 8 weeks otherwise.

## 2024-02-16 ENCOUNTER — Ambulatory Visit: Payer: Self-pay | Admitting: Family Medicine

## 2024-03-01 ENCOUNTER — Ambulatory Visit: Admitting: Family Medicine

## 2024-03-29 NOTE — Progress Notes (Unsigned)
" °  Randall Jimenez [facility] [address] [place] [address] Phone: [phone] Subjective:   Randall Jimenez am a scribe for Dr. Claudene.   I'm seeing this patient by the request  of:  Little, Franky, MD  CC: Back and neck pain follow-up  YEP:Dlagzrupcz  Randall Jimenez is a 69 y.o. male coming in with complaint of back and neck pain. OMT on 02/09/2024. Patient states that his lower back hasn't improved as much. His neck is fine. He hasn't been playing much soccer. Went to play soccer twice since he was here last and he was hurting both times. One time he used a back brace and he feels like it might have helped. There isn't any pain unless he is playing soccer or running. Pain is located on the left lower side of his back.   Medications patient has been prescribed:   Taking:         Reviewed prior external information including notes and imaging from previsou exam, outside providers and external EMR if available.   As well as notes that were available from care everywhere and other healthcare systems.  Past medical history, social, surgical and family history all reviewed in electronic medical record.  No pertanent information unless stated regarding to the chief complaint.   Past Medical History:  Diagnosis Date   Coronary artery disease 07/13/2020   S/p anterior STEMI in May 2022 treated with a DES to the LAD Echo May 2022: EF 55-60   Hyperlipidemia    Hypertension     Allergies[1]   Review of Systems:  No headache, visual changes, nausea, vomiting, diarrhea, constipation, dizziness, abdominal pain, skin rash, fevers, chills, night sweats, weight loss, swollen lymph nodes, body aches, joint swelling, chest pain, shortness of breath, mood changes. POSITIVE muscle aches  Objective  Blood pressure 120/70, pulse 68, height 5' 7 (1.702 m), weight 197 lb 9.6 oz (89.6 kg), SpO2 100%.   General: No apparent distress alert and oriented x3 mood and  affect normal, dressed appropriately.  HEENT: Pupils equal, extraocular movements intact  Respiratory: Patient's speak in full sentences and does not appear short of breath  Cardiovascular: No lower extremity edema, non tender, no erythema  Gait MSK:  Back back exam does have some mild loss lordosis.  Patient though is able to go from a seated to standing position okay.  Worsening of pain with extension of the back.      Assessment and Plan:  SI (sacroiliac) joint dysfunction Concerning patient's x-rays that do show that patient has a little spondylolisthesis could be contributing to more of the back pain.  Discussed with patient about the possibility of advanced imaging.  Patient wants to hold at this point and continue with some conservative therapy.  We discussed icing regimen and home exercises, discussed which activities to do and which ones to avoid.  Increase activity slowly.  Follow-up again in 6 to 8 weeks          The above documentation has been reviewed and is accurate and complete Rhyan Wolters M Dariona Postma, DO       Note: This dictation was prepared with Dragon dictation along with smaller phrase technology. Any transcriptional errors that result from this process are unintentional.            [1]  Allergies Allergen Reactions   Penicillins Itching   "

## 2024-03-30 ENCOUNTER — Ambulatory Visit: Admitting: Family Medicine

## 2024-03-30 VITALS — BP 120/70 | HR 68 | Ht 67.0 in | Wt 197.6 lb

## 2024-03-30 DIAGNOSIS — M533 Sacrococcygeal disorders, not elsewhere classified: Secondary | ICD-10-CM | POA: Diagnosis not present

## 2024-03-30 NOTE — Patient Instructions (Addendum)
 Good to see you. If you change your mind, write me about the MRI.

## 2024-03-30 NOTE — Assessment & Plan Note (Signed)
 Concerning patient's x-rays that do show that patient has a little spondylolisthesis could be contributing to more of the back pain.  Discussed with patient about the possibility of advanced imaging.  Patient wants to hold at this point and continue with some conservative therapy.  We discussed icing regimen and home exercises, discussed which activities to do and which ones to avoid.  Increase activity slowly.  Follow-up again in 6 to 8 weeks

## 2024-05-05 ENCOUNTER — Ambulatory Visit: Admitting: Cardiovascular Disease
